# Patient Record
Sex: Male | Born: 1980 | Race: Black or African American | Hispanic: No | Marital: Single | Smoking: Current some day smoker
Health system: Southern US, Community
[De-identification: ages and names within clinical notes are randomized; demographics above are authoritative.]

## PROBLEM LIST (undated history)

## (undated) DIAGNOSIS — Z969 Presence of functional implant, unspecified: Secondary | ICD-10-CM

## (undated) DIAGNOSIS — Z8614 Personal history of Methicillin resistant Staphylococcus aureus infection: Secondary | ICD-10-CM

## (undated) DIAGNOSIS — IMO0002 Reserved for concepts with insufficient information to code with codable children: Secondary | ICD-10-CM

---
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
## 2000-08-25 ENCOUNTER — Emergency Department (HOSPITAL_COMMUNITY): Admission: EM | Admit: 2000-08-25 | Discharge: 2000-08-25 | Payer: Self-pay | Admitting: Emergency Medicine

## 2000-08-30 ENCOUNTER — Emergency Department (HOSPITAL_COMMUNITY): Admission: EM | Admit: 2000-08-30 | Discharge: 2000-08-30 | Payer: Self-pay

## 2000-08-30 ENCOUNTER — Encounter: Payer: Self-pay | Admitting: Emergency Medicine

## 2000-08-31 ENCOUNTER — Encounter: Payer: Self-pay | Admitting: Emergency Medicine

## 2000-08-31 ENCOUNTER — Emergency Department (HOSPITAL_COMMUNITY): Admission: EM | Admit: 2000-08-31 | Discharge: 2000-08-31 | Payer: Self-pay | Admitting: Emergency Medicine

## 2000-11-08 ENCOUNTER — Emergency Department (HOSPITAL_COMMUNITY): Admission: EM | Admit: 2000-11-08 | Discharge: 2000-11-09 | Payer: Self-pay | Admitting: Emergency Medicine

## 2000-11-09 ENCOUNTER — Encounter: Payer: Self-pay | Admitting: Emergency Medicine

## 2001-11-24 ENCOUNTER — Emergency Department (HOSPITAL_COMMUNITY): Admission: EM | Admit: 2001-11-24 | Discharge: 2001-11-24 | Payer: Self-pay | Admitting: Emergency Medicine

## 2002-01-01 ENCOUNTER — Emergency Department (HOSPITAL_COMMUNITY): Admission: EM | Admit: 2002-01-01 | Discharge: 2002-01-01 | Payer: Self-pay | Admitting: Emergency Medicine

## 2002-01-06 ENCOUNTER — Emergency Department (HOSPITAL_COMMUNITY): Admission: EM | Admit: 2002-01-06 | Discharge: 2002-01-06 | Payer: Self-pay | Admitting: Emergency Medicine

## 2002-02-04 ENCOUNTER — Emergency Department (HOSPITAL_COMMUNITY): Admission: EM | Admit: 2002-02-04 | Discharge: 2002-02-04 | Payer: Self-pay | Admitting: Emergency Medicine

## 2002-02-26 ENCOUNTER — Emergency Department (HOSPITAL_COMMUNITY): Admission: EM | Admit: 2002-02-26 | Discharge: 2002-02-26 | Payer: Self-pay | Admitting: Emergency Medicine

## 2002-08-06 ENCOUNTER — Emergency Department (HOSPITAL_COMMUNITY): Admission: EM | Admit: 2002-08-06 | Discharge: 2002-08-07 | Payer: Self-pay | Admitting: Specialist

## 2002-08-28 ENCOUNTER — Emergency Department (HOSPITAL_COMMUNITY): Admission: EM | Admit: 2002-08-28 | Discharge: 2002-08-28 | Payer: Self-pay | Admitting: Emergency Medicine

## 2002-09-15 ENCOUNTER — Emergency Department (HOSPITAL_COMMUNITY): Admission: EM | Admit: 2002-09-15 | Discharge: 2002-09-15 | Payer: Self-pay | Admitting: Emergency Medicine

## 2002-09-15 ENCOUNTER — Encounter: Payer: Self-pay | Admitting: *Deleted

## 2003-01-05 ENCOUNTER — Emergency Department (HOSPITAL_COMMUNITY): Admission: EM | Admit: 2003-01-05 | Discharge: 2003-01-05 | Payer: Self-pay | Admitting: Emergency Medicine

## 2003-01-06 ENCOUNTER — Emergency Department (HOSPITAL_COMMUNITY): Admission: AD | Admit: 2003-01-06 | Discharge: 2003-01-06 | Payer: Self-pay | Admitting: Family Medicine

## 2003-01-10 ENCOUNTER — Encounter: Admission: RE | Admit: 2003-01-10 | Discharge: 2003-01-10 | Payer: Self-pay | Admitting: Internal Medicine

## 2003-01-21 ENCOUNTER — Ambulatory Visit: Admission: RE | Admit: 2003-01-21 | Discharge: 2003-01-21 | Payer: Self-pay | Admitting: Internal Medicine

## 2003-01-23 ENCOUNTER — Encounter: Admission: RE | Admit: 2003-01-23 | Discharge: 2003-01-23 | Payer: Self-pay | Admitting: Internal Medicine

## 2003-02-17 ENCOUNTER — Encounter: Admission: RE | Admit: 2003-02-17 | Discharge: 2003-02-17 | Payer: Self-pay | Admitting: Internal Medicine

## 2003-02-20 ENCOUNTER — Emergency Department (HOSPITAL_COMMUNITY): Admission: EM | Admit: 2003-02-20 | Discharge: 2003-02-20 | Payer: Self-pay | Admitting: Emergency Medicine

## 2003-06-01 ENCOUNTER — Emergency Department (HOSPITAL_COMMUNITY): Admission: EM | Admit: 2003-06-01 | Discharge: 2003-06-01 | Payer: Self-pay | Admitting: Emergency Medicine

## 2003-09-10 ENCOUNTER — Emergency Department (HOSPITAL_COMMUNITY): Admission: EM | Admit: 2003-09-10 | Discharge: 2003-09-10 | Payer: Self-pay | Admitting: Emergency Medicine

## 2003-11-02 ENCOUNTER — Emergency Department (HOSPITAL_COMMUNITY): Admission: EM | Admit: 2003-11-02 | Discharge: 2003-11-02 | Payer: Self-pay | Admitting: Emergency Medicine

## 2003-12-12 ENCOUNTER — Emergency Department (HOSPITAL_COMMUNITY): Admission: EM | Admit: 2003-12-12 | Discharge: 2003-12-12 | Payer: Self-pay | Admitting: Emergency Medicine

## 2004-04-05 ENCOUNTER — Emergency Department (HOSPITAL_COMMUNITY): Admission: EM | Admit: 2004-04-05 | Discharge: 2004-04-05 | Payer: Self-pay | Admitting: Emergency Medicine

## 2005-11-27 ENCOUNTER — Emergency Department (HOSPITAL_COMMUNITY): Admission: EM | Admit: 2005-11-27 | Discharge: 2005-11-27 | Payer: Self-pay | Admitting: Emergency Medicine

## 2006-03-31 ENCOUNTER — Emergency Department (HOSPITAL_COMMUNITY): Admission: EM | Admit: 2006-03-31 | Discharge: 2006-03-31 | Payer: Self-pay | Admitting: Emergency Medicine

## 2006-05-30 ENCOUNTER — Emergency Department (HOSPITAL_COMMUNITY): Admission: EM | Admit: 2006-05-30 | Discharge: 2006-05-30 | Payer: Self-pay | Admitting: Emergency Medicine

## 2006-06-08 ENCOUNTER — Emergency Department (HOSPITAL_COMMUNITY): Admission: EM | Admit: 2006-06-08 | Discharge: 2006-06-08 | Payer: Self-pay | Admitting: Emergency Medicine

## 2006-12-19 ENCOUNTER — Emergency Department (HOSPITAL_COMMUNITY): Admission: EM | Admit: 2006-12-19 | Discharge: 2006-12-19 | Payer: Self-pay | Admitting: Emergency Medicine

## 2006-12-28 ENCOUNTER — Emergency Department (HOSPITAL_COMMUNITY): Admission: EM | Admit: 2006-12-28 | Discharge: 2006-12-28 | Payer: Self-pay | Admitting: Emergency Medicine

## 2007-04-08 ENCOUNTER — Emergency Department (HOSPITAL_COMMUNITY): Admission: EM | Admit: 2007-04-08 | Discharge: 2007-04-08 | Payer: Self-pay | Admitting: Emergency Medicine

## 2007-07-08 ENCOUNTER — Emergency Department (HOSPITAL_COMMUNITY): Admission: EM | Admit: 2007-07-08 | Discharge: 2007-07-08 | Payer: Self-pay | Admitting: Emergency Medicine

## 2007-08-11 ENCOUNTER — Emergency Department (HOSPITAL_COMMUNITY): Admission: EM | Admit: 2007-08-11 | Discharge: 2007-08-12 | Payer: Self-pay | Admitting: Emergency Medicine

## 2007-09-03 ENCOUNTER — Emergency Department (HOSPITAL_COMMUNITY): Admission: EM | Admit: 2007-09-03 | Discharge: 2007-09-04 | Payer: Self-pay | Admitting: Emergency Medicine

## 2007-09-08 ENCOUNTER — Emergency Department (HOSPITAL_COMMUNITY): Admission: EM | Admit: 2007-09-08 | Discharge: 2007-09-08 | Payer: Self-pay | Admitting: Emergency Medicine

## 2007-09-11 ENCOUNTER — Emergency Department (HOSPITAL_COMMUNITY): Admission: EM | Admit: 2007-09-11 | Discharge: 2007-09-11 | Payer: Self-pay | Admitting: Family Medicine

## 2007-09-25 ENCOUNTER — Emergency Department (HOSPITAL_COMMUNITY): Admission: EM | Admit: 2007-09-25 | Discharge: 2007-09-25 | Payer: Self-pay | Admitting: Family Medicine

## 2007-10-18 ENCOUNTER — Encounter: Admission: RE | Admit: 2007-10-18 | Discharge: 2007-10-18 | Payer: Self-pay | Admitting: Internal Medicine

## 2007-11-04 ENCOUNTER — Emergency Department (HOSPITAL_COMMUNITY): Admission: EM | Admit: 2007-11-04 | Discharge: 2007-11-04 | Payer: Self-pay | Admitting: Emergency Medicine

## 2007-12-11 ENCOUNTER — Emergency Department (HOSPITAL_COMMUNITY): Admission: EM | Admit: 2007-12-11 | Discharge: 2007-12-11 | Payer: Self-pay | Admitting: Emergency Medicine

## 2007-12-19 ENCOUNTER — Emergency Department (HOSPITAL_COMMUNITY): Admission: EM | Admit: 2007-12-19 | Discharge: 2007-12-19 | Payer: Self-pay | Admitting: Emergency Medicine

## 2008-06-17 ENCOUNTER — Other Ambulatory Visit: Payer: Self-pay | Admitting: Emergency Medicine

## 2008-06-17 ENCOUNTER — Ambulatory Visit: Payer: Self-pay | Admitting: Psychiatry

## 2008-06-18 ENCOUNTER — Observation Stay (HOSPITAL_COMMUNITY): Admission: AD | Admit: 2008-06-18 | Discharge: 2008-06-18 | Payer: Self-pay | Admitting: Psychiatry

## 2008-07-27 ENCOUNTER — Emergency Department (HOSPITAL_COMMUNITY): Admission: EM | Admit: 2008-07-27 | Discharge: 2008-07-27 | Payer: Self-pay | Admitting: Emergency Medicine

## 2008-09-09 ENCOUNTER — Emergency Department (HOSPITAL_COMMUNITY): Admission: EM | Admit: 2008-09-09 | Discharge: 2008-09-10 | Payer: Self-pay | Admitting: Emergency Medicine

## 2008-09-30 ENCOUNTER — Emergency Department (HOSPITAL_COMMUNITY): Admission: EM | Admit: 2008-09-30 | Discharge: 2008-09-30 | Payer: Self-pay | Admitting: Emergency Medicine

## 2008-11-10 ENCOUNTER — Other Ambulatory Visit: Payer: Self-pay | Admitting: Emergency Medicine

## 2008-11-11 ENCOUNTER — Inpatient Hospital Stay (HOSPITAL_COMMUNITY): Admission: AD | Admit: 2008-11-11 | Discharge: 2008-11-12 | Payer: Self-pay | Admitting: Psychiatry

## 2008-11-11 ENCOUNTER — Ambulatory Visit: Payer: Self-pay | Admitting: Psychiatry

## 2008-11-27 ENCOUNTER — Emergency Department (HOSPITAL_COMMUNITY): Admission: EM | Admit: 2008-11-27 | Discharge: 2008-11-27 | Payer: Self-pay | Admitting: Emergency Medicine

## 2008-12-02 ENCOUNTER — Emergency Department (HOSPITAL_COMMUNITY): Admission: EM | Admit: 2008-12-02 | Discharge: 2008-12-02 | Payer: Self-pay | Admitting: Emergency Medicine

## 2008-12-09 ENCOUNTER — Emergency Department (HOSPITAL_COMMUNITY): Admission: EM | Admit: 2008-12-09 | Discharge: 2008-12-09 | Payer: Self-pay | Admitting: Emergency Medicine

## 2009-03-16 ENCOUNTER — Emergency Department (HOSPITAL_COMMUNITY): Admission: EM | Admit: 2009-03-16 | Discharge: 2009-03-16 | Payer: Self-pay | Admitting: Family Medicine

## 2009-04-23 ENCOUNTER — Other Ambulatory Visit: Payer: Self-pay | Admitting: Emergency Medicine

## 2009-04-24 ENCOUNTER — Other Ambulatory Visit: Payer: Self-pay | Admitting: Emergency Medicine

## 2009-04-24 ENCOUNTER — Ambulatory Visit: Payer: Self-pay | Admitting: Psychiatry

## 2009-04-24 ENCOUNTER — Observation Stay (HOSPITAL_COMMUNITY): Admission: EM | Admit: 2009-04-24 | Discharge: 2009-04-24 | Payer: Self-pay | Admitting: Psychiatry

## 2009-06-04 ENCOUNTER — Emergency Department (HOSPITAL_COMMUNITY): Admission: EM | Admit: 2009-06-04 | Discharge: 2009-06-05 | Payer: Self-pay | Admitting: Emergency Medicine

## 2009-06-06 ENCOUNTER — Emergency Department (HOSPITAL_COMMUNITY): Admission: EM | Admit: 2009-06-06 | Discharge: 2009-06-06 | Payer: Self-pay | Admitting: Emergency Medicine

## 2009-06-06 ENCOUNTER — Emergency Department (HOSPITAL_COMMUNITY): Admission: EM | Admit: 2009-06-06 | Discharge: 2009-06-06 | Payer: Self-pay | Admitting: Family Medicine

## 2009-06-09 ENCOUNTER — Emergency Department (HOSPITAL_COMMUNITY): Admission: EM | Admit: 2009-06-09 | Discharge: 2009-06-09 | Payer: Self-pay | Admitting: Emergency Medicine

## 2009-06-14 ENCOUNTER — Inpatient Hospital Stay (HOSPITAL_COMMUNITY)
Admission: EM | Admit: 2009-06-14 | Discharge: 2009-06-16 | Payer: Self-pay | Source: Home / Self Care | Admitting: Emergency Medicine

## 2009-06-15 HISTORY — PX: ORIF RADIUS & ULNA FRACTURES: SHX2129

## 2009-07-13 ENCOUNTER — Emergency Department (HOSPITAL_COMMUNITY): Admission: EM | Admit: 2009-07-13 | Discharge: 2009-07-13 | Payer: Self-pay | Admitting: Family Medicine

## 2009-07-17 ENCOUNTER — Emergency Department (HOSPITAL_COMMUNITY): Admission: EM | Admit: 2009-07-17 | Discharge: 2009-07-17 | Payer: Self-pay | Admitting: Emergency Medicine

## 2010-04-26 LAB — URINALYSIS, ROUTINE W REFLEX MICROSCOPIC
Bilirubin Urine: NEGATIVE
Glucose, UA: NEGATIVE mg/dL
Ketones, ur: 15 mg/dL — AB
pH: 6 (ref 5.0–8.0)

## 2010-04-26 LAB — RAPID URINE DRUG SCREEN, HOSP PERFORMED
Benzodiazepines: NOT DETECTED
Cocaine: POSITIVE — AB
Tetrahydrocannabinol: NOT DETECTED

## 2010-04-27 LAB — COMPREHENSIVE METABOLIC PANEL
ALT: 19 U/L (ref 0–53)
CO2: 24 mEq/L (ref 19–32)
Calcium: 8.6 mg/dL (ref 8.4–10.5)
Creatinine, Ser: 1.33 mg/dL (ref 0.4–1.5)
GFR calc non Af Amer: >60 mL/min (ref >60)
Glucose, Bld: 106 mg/dL — ABNORMAL HIGH (ref 70–99)
Sodium: 140 mEq/L (ref 135–145)
Total Bilirubin: 0.6 mg/dL (ref 0.3–1.2)

## 2010-04-27 LAB — CBC
Hemoglobin: 14.3 g/dL (ref 13.0–17.0)
MCHC: 33.7 g/dL (ref 30.0–36.0)
MCV: 86.1 fL (ref 78.0–100.0)
RBC: 4.91 MIL/uL (ref 4.22–5.81)

## 2010-04-27 LAB — ETHANOL: Alcohol, Ethyl (B): 282 mg/dL — ABNORMAL HIGH (ref 0–10)

## 2010-04-27 LAB — PROTIME-INR: Prothrombin Time: 12 seconds (ref 11.6–15.2)

## 2010-04-27 LAB — CK TOTAL AND CKMB (NOT AT ARMC): Total CK: 291 U/L — ABNORMAL HIGH (ref 7–232)

## 2010-04-30 LAB — URINALYSIS, ROUTINE W REFLEX MICROSCOPIC
Bilirubin Urine: NEGATIVE
Hgb urine dipstick: NEGATIVE
Ketones, ur: NEGATIVE mg/dL
Nitrite: NEGATIVE
Urobilinogen, UA: 1 mg/dL (ref 0.0–1.0)

## 2010-04-30 LAB — RAPID URINE DRUG SCREEN, HOSP PERFORMED
Amphetamines: NOT DETECTED
Tetrahydrocannabinol: NOT DETECTED

## 2010-04-30 LAB — CBC
HCT: 40.8 % (ref 39.0–52.0)
Hemoglobin: 13.3 g/dL (ref 13.0–17.0)
MCHC: 32.4 g/dL (ref 30.0–36.0)
MCV: 86.6 fL (ref 78.0–100.0)
RDW: 14.1 % (ref 11.5–15.5)

## 2010-04-30 LAB — DIFFERENTIAL
Basophils Absolute: 0.1 10*3/uL (ref 0.0–0.1)
Basophils Relative: 1 % (ref 0–1)
Eosinophils Absolute: 0.1 10*3/uL (ref 0.0–0.7)
Eosinophils Relative: 1 % (ref 0–5)
Monocytes Absolute: 0.7 10*3/uL (ref 0.1–1.0)
Neutro Abs: 2.8 10*3/uL (ref 1.7–7.7)

## 2010-04-30 LAB — BASIC METABOLIC PANEL
CO2: 28 mEq/L (ref 19–32)
Calcium: 8.8 mg/dL (ref 8.4–10.5)
Chloride: 108 mEq/L (ref 96–112)
Glucose, Bld: 111 mg/dL — ABNORMAL HIGH (ref 70–99)
Sodium: 142 mEq/L (ref 135–145)

## 2010-05-12 LAB — POCT I-STAT, CHEM 8
BUN: 7 mg/dL (ref 6–23)
Chloride: 105 mEq/L (ref 96–112)
HCT: 43 % (ref 39.0–52.0)
Sodium: 142 mEq/L (ref 135–145)
TCO2: 26 mmol/L (ref 0–100)

## 2010-05-12 LAB — GLUCOSE, CAPILLARY

## 2010-05-13 LAB — POCT I-STAT, CHEM 8
BUN: 5 mg/dL — ABNORMAL LOW (ref 6–23)
Calcium, Ion: 1.11 mmol/L — ABNORMAL LOW (ref 1.12–1.32)
Chloride: 105 mEq/L (ref 96–112)
Glucose, Bld: 83 mg/dL (ref 70–99)
HCT: 45 % (ref 39.0–52.0)
Potassium: 3.5 mEq/L (ref 3.5–5.1)

## 2010-05-13 LAB — DIFFERENTIAL
Basophils Absolute: 0 10*3/uL (ref 0.0–0.1)
Basophils Relative: 0 % (ref 0–1)
Eosinophils Absolute: 0.1 10*3/uL (ref 0.0–0.7)
Monocytes Relative: 11 % (ref 3–12)
Neutro Abs: 2.5 10*3/uL (ref 1.7–7.7)
Neutrophils Relative %: 38 % — ABNORMAL LOW (ref 43–77)

## 2010-05-13 LAB — CBC
MCHC: 33.6 g/dL (ref 30.0–36.0)
MCV: 85 fL (ref 78.0–100.0)
Platelets: 303 10*3/uL (ref 150–400)
RDW: 14 % (ref 11.5–15.5)

## 2010-05-13 LAB — RAPID URINE DRUG SCREEN, HOSP PERFORMED
Amphetamines: NOT DETECTED
Barbiturates: NOT DETECTED
Benzodiazepines: NOT DETECTED
Cocaine: POSITIVE — AB

## 2010-05-13 LAB — BASIC METABOLIC PANEL
BUN: 14 mg/dL (ref 6–23)
CO2: 27 mEq/L (ref 19–32)
Calcium: 9.3 mg/dL (ref 8.4–10.5)
Chloride: 110 mEq/L (ref 96–112)
Creatinine, Ser: 1.15 mg/dL (ref 0.4–1.5)

## 2010-05-15 LAB — BASIC METABOLIC PANEL
BUN: 11 mg/dL (ref 6–23)
Calcium: 9.1 mg/dL (ref 8.4–10.5)
Creatinine, Ser: 1.32 mg/dL (ref 0.4–1.5)
GFR calc Af Amer: >60 mL/min (ref >60)
GFR calc non Af Amer: >60 mL/min (ref >60)
GFR calc non Af Amer: >60 mL/min (ref >60)
Glucose, Bld: 84 mg/dL (ref 70–99)
Potassium: 3.6 mEq/L (ref 3.5–5.1)
Sodium: 140 mEq/L (ref 135–145)

## 2010-05-15 LAB — CK TOTAL AND CKMB (NOT AT ARMC)
Relative Index: 0.5 (ref 0.0–2.5)
Total CK: 194 U/L (ref 7–232)

## 2010-05-15 LAB — DIFFERENTIAL
Basophils Absolute: 0 10*3/uL (ref 0.0–0.1)
Lymphocytes Relative: 42 % (ref 12–46)
Lymphs Abs: 2.5 10*3/uL (ref 0.7–4.0)
Monocytes Absolute: 0.5 10*3/uL (ref 0.1–1.0)
Neutro Abs: 3 10*3/uL (ref 1.7–7.7)

## 2010-05-15 LAB — POCT CARDIAC MARKERS
CKMB, poc: <1 ng/mL — ABNORMAL LOW (ref 1.0–8.0)
CKMB, poc: <1 ng/mL — ABNORMAL LOW (ref 1.0–8.0)
Myoglobin, poc: 52.2 ng/mL (ref 12–200)
Troponin i, poc: <0.05 ng/mL (ref 0.00–0.09)
Troponin i, poc: <0.05 ng/mL (ref 0.00–0.09)

## 2010-05-15 LAB — CBC
Hemoglobin: 14.6 g/dL (ref 13.0–17.0)
Platelets: 319 10*3/uL (ref 150–400)
RDW: 14.4 % (ref 11.5–15.5)
WBC: 6.1 10*3/uL (ref 4.0–10.5)
WBC: 6.8 10*3/uL (ref 4.0–10.5)

## 2010-05-15 LAB — D-DIMER, QUANTITATIVE: D-Dimer, Quant: <0.22 ug/mL-FEU (ref 0.00–0.48)

## 2010-05-17 LAB — POCT CARDIAC MARKERS: Myoglobin, poc: 57.6 ng/mL (ref 12–200)

## 2010-05-17 LAB — D-DIMER, QUANTITATIVE: D-Dimer, Quant: <0.22 ug/mL-FEU (ref 0.00–0.48)

## 2010-05-17 LAB — CBC
Hemoglobin: 13.3 g/dL (ref 13.0–17.0)
MCHC: 33.2 g/dL (ref 30.0–36.0)
MCV: 84.9 fL (ref 78.0–100.0)
RBC: 4.71 MIL/uL (ref 4.22–5.81)

## 2010-05-17 LAB — PROTIME-INR: Prothrombin Time: 13.1 seconds (ref 11.6–15.2)

## 2010-05-17 LAB — COMPREHENSIVE METABOLIC PANEL
ALT: 27 U/L (ref 0–53)
CO2: 26 mEq/L (ref 19–32)
Calcium: 8.7 mg/dL (ref 8.4–10.5)
Creatinine, Ser: 1.13 mg/dL (ref 0.4–1.5)
GFR calc non Af Amer: >60 mL/min (ref >60)
Glucose, Bld: 101 mg/dL — ABNORMAL HIGH (ref 70–99)
Sodium: 137 mEq/L (ref 135–145)

## 2010-05-17 LAB — DIFFERENTIAL
Basophils Absolute: 0 10*3/uL (ref 0.0–0.1)
Eosinophils Absolute: 0.1 10*3/uL (ref 0.0–0.7)
Lymphs Abs: 2.1 10*3/uL (ref 0.7–4.0)
Neutrophils Relative %: 51 % (ref 43–77)

## 2010-05-18 LAB — CBC
HCT: 43.8 % (ref 39.0–52.0)
Hemoglobin: 14.9 g/dL (ref 13.0–17.0)
MCV: 84.2 fL (ref 78.0–100.0)
Platelets: 332 10*3/uL (ref 150–400)
RBC: 5.2 MIL/uL (ref 4.22–5.81)
WBC: 7.1 10*3/uL (ref 4.0–10.5)

## 2010-05-18 LAB — POCT I-STAT, CHEM 8
Chloride: 104 mEq/L (ref 96–112)
HCT: 49 % (ref 39.0–52.0)
Hemoglobin: 16.7 g/dL (ref 13.0–17.0)
Potassium: 3.2 mEq/L — ABNORMAL LOW (ref 3.5–5.1)
Sodium: 143 mEq/L (ref 135–145)

## 2010-05-18 LAB — DIFFERENTIAL
Eosinophils Absolute: 0.2 10*3/uL (ref 0.0–0.7)
Eosinophils Relative: 2 % (ref 0–5)
Lymphs Abs: 2.6 10*3/uL (ref 0.7–4.0)
Monocytes Absolute: 0.5 10*3/uL (ref 0.1–1.0)
Monocytes Relative: 7 % (ref 3–12)

## 2010-05-18 LAB — RAPID URINE DRUG SCREEN, HOSP PERFORMED
Amphetamines: NOT DETECTED
Benzodiazepines: NOT DETECTED
Tetrahydrocannabinol: NOT DETECTED

## 2010-06-22 NOTE — Consult Note (Signed)
NAMEZARIAN, COLPITTS NO.:  [id number]   MEDICAL RECORD NO.:  [id number]          PATIENT TYPE:  IPS   LOCATION:  0504                          FACILITY:  [facility]   PHYSICIAN:  Antonietta Breach, M.D.  DATE OF BIRTH:  1980/08/14   DATE OF CONSULTATION:  06/17/2008  DATE OF DISCHARGE:                                 CONSULTATION   REQUESTING PHYSICIAN:  [facility].   REASON FOR CONSULTATION:  Psychosis.   HISTORY OF PRESENT ILLNESS:  Jermaine Hunt is a 30 year old male  presenting to the [facility] with  psychosis.  He has been  seeing leprechauns dancing around.  These visual hallucinations do occur  when he is completely wide awake.  He has been responding to internal  stimulation and has been displaying bizarre behavior.  At one point in  the emergency room, he held his mouth up as if he was swallowing  something and from what the staff nurse can gather, he did not have  anything in his hand.  However, he is undergoing medical clearance  workup.   The precipitating stress is unknown.  However, he has been drinking some  alcohol.  He is not slurring his speech.  He goes through periods of  poverty of speech.  He does have a prior diagnosis of schizoaffective  disorder.   The patient had called the [facility] stating that he  could not go on any more.  They dispatched his case manager.  He  reported to the case manager, the social worker with him, that he wanted  to end it.  He had reported to the emergency room staff that he was  going to jump off a building.  He clearly is demonstrating labile mood.   PAST PSYCHIATRIC HISTORY:  He has been diagnosed with schizoaffective  disorder.  He has been noncompliant with medication.  A social worker is  present with the patient from the community.  She states that he has a  pattern of noncompliance with medications.  The undersigned asked her if  she knows whether he  has ever had intramuscular decanoate maintenance  and she does not know.   FAMILY PSYCHIATRIC HISTORY:  None known.   SOCIAL HISTORY:  As mentioned, a community Child psychotherapist is with the  patient.  He is unemployed and medically disabled.  He does drink  alcohol.  There are no illegal drugs known.  As reported, he is a poor  historian at this time.  He does smoke cigarettes.  He states that he  lives alone.   PAST MEDICAL HISTORY:  None known.   MEDICATIONS:  His MAR is reviewed.  His medications have not been  started.  However, his medication list on presentation included Seroquel  and Abilify, dosages unknown.   ALLERGIES:  SULFA.   LABORATORY DATA:  WBC 7.1, hemoglobin 14.9, platelet count 332.   REVIEW OF SYSTEMS:  Jermaine Hunt can provide little of this.  It is gleaned  from the staff, the medical record and past medical record.  Constitutional, head, eyes, ears, nose, throat,  mouth, neurologic,  psychiatric, cardiovascular, respiratory, gastrointestinal,  genitourinary, skin, musculoskeletal, hematologic, lymphatic, endocrine,  metabolic all unremarkable.   PHYSICAL EXAMINATION:  VITAL SIGNS:  Temperature 98, pulse 78,  respiratory rate 20, blood pressure 141/68, O2 saturation on room air  98%.  GENERAL APPEARANCE:  Jermaine Hunt is a young male appearing his chronologic  age, obese, lying in a supine position in his hospital bed with no  abnormal involuntary movements.   MENTAL STATUS EXAM:  Jermaine Hunt does have poverty of speech.  He does go  on to describe the leprechauns mentioned in the history of present  illness, but then is guarded regarding any further history.  He does  mention that he would like a soda.  He is alert.  His attention span is  slightly decreased.  Concentration is mildly decreased.  His affect is  flat at this time.  His mood is slightly anxious.  He is grossly  oriented to all spheres.  His memory is grossly intact.  His fund of  knowledge and  intelligence are difficult to discern.  His speech is  impoverished.  When he does talk, there is no dysarthria or pressured  speech.  Thought process is coherent, but there is some illogia thought  content.  He describes leprechauns as mentioned in the history of  present illness.  His insight is poor.  Judgment is impaired.   ASSESSMENT:  AXIS I:  293.82, psychotic disorder not otherwise  specified.  AXIS II:  Deferred.  AXIS III:  See past medical history.  AXIS IV:  Primary support group.  AXIS V:  20   RECOMMENDATIONS:  1. Low stimulation ego support in order to facilitate a therapeutic      alliance.  2. Low stimulation environment with a sitter.  3. Ativan one-half to 4 mg p.o. IM or IV q.2 h. p.r.n. agitation,      severe anxiety.  4. We will defer antipsychotic medication at this time.  5. Would admit to an inpatient psychiatric unit for further evaluation      and treatment as soon as the patient is medically cleared.  6. Regarding the Ativan use, would utilize caution regarding sedation      or ataxia.      Antonietta Breach, M.D.  Electronically Signed     JW/MEDQ  D:  06/18/2008  T:  06/18/2008  Job:  [id number]

## 2010-06-25 NOTE — Consult Note (Signed)
[facility]  Patient:    Jermaine Hunt, Jermaine Hunt                    MRN: [id number] Proc. Date: 08/31/00 Adm. Date:  [id number] Disc. Date: [id number] Attending:  Devoria Albe                          Consultation Report  VISIT NUMBER:  [id number]  REASON FOR CONSULTATION:  I was asked by EDP to reevaluate Jermaine Hunt.  He was seen yesterday evening with some abdominal pain which had been going on for six days, located in the right lower quadrant, and he had apparently a little right lower quadrant tenderness but negative laboratory studies.  He had a CT scan done which showed a questionable fecalith and question of thickening of the wall of the appendix.  He was seen back here this morning in followup and was apparently better, pain had gone away, was not having any other GI symptoms and with what he described as his minor pain, was more mid-abdomen and he had no tenderness at all.  This was a scheduled visit back for followup.  He came back again this evening, arriving about 5:50, with increased pain again.  This time it seemed to be sharp, radiating a little bit into the right lower abdomen and he did not have much appetite.  Again, this time electrolytes were checked and were unremarkable.  Amylase was normal.  WBC remained 6400 with a normal differential.  CT was repeated and I have reviewed the films with the radiologist.  There does appear to be a small fecalith in the appendix and there is some question that the appendiceal wall is slightly thicker than normal but there is no evidence around the appendix of any inflammatory changes.  When I examined him, he says that he thinks his pain is more muscular and he is really pointing to the left abdomen rather than the right, as he had been earlier.  He has had no nausea or vomiting, fever or chills.  PHYSICAL EXAMINATION:  GENERAL:  On exam, he is alert and oriented.  He is afebrile.  HEENT:   Normal.  LUNGS:  Clear.  HEART:  Regular.  ABDOMEN:  Soft and I can palpate quite deeply into the right lower quadrant without any significant tenderness.  IMPRESSION:  Right lower quadrant pain.  This may be an atypical appendicitis. Certainly, the history is somewhat prolonged.  The CT scan findings are minimal but there is a fecalith present.  He is with no white count and no fever and no secondary signs of appendicitis such as fat stranding, etc, and certainly, no worsening in 24 hours with no treatment, it is hard to make a clear-cut diagnosis that this is appendicitis.  PLAN:  I have gone over alternatives with the patient including going ahead with the laparoscopy and laparoscopic appendectomy tonight.  He is still a little reluctant and says he was feeling somewhat better.  I told him that he probably should be rechecked again in 12 to 24 hours, but at this point, I thought that was acceptable, since we do not have clear-cut evidence of appendicitis. DD:  08/31/00 TD:  09/01/00 Job: [id number]

## 2010-06-25 NOTE — Discharge Summary (Signed)
NAME:  Jermaine Hunt, BARUCH NO.:  [id number]   MEDICAL RECORD NO.:  [id number]          PATIENT TYPE:  OBV   LOCATION:  0504                          FACILITY:  [facility]   PHYSICIAN:  Geoffery Lyons, M.D.      DATE OF BIRTH:  1980-12-29   DATE OF ADMISSION:  06/18/2008  DATE OF DISCHARGE:  06/18/2008                               DISCHARGE SUMMARY   CHIEF COMPLAINT:  This was the first admission to [facility] for this 30 year old male who presented to the emergency room at  [name] with symptoms of psychoses, visual hallucinations while  awake.  He has been displaying bizarre behavior.  He had been drinking  alcohol.  Had a prior diagnosis of schizoaffective disorder.  He had  called the [name] and stated that he could not go on anymore.  He reported to the case manager, the social worker with him, that he  wanted to end it.  Was going to jump off a building.  History as already  stated, diagnosed with schizoaffective disorder, noncompliant with  medication.   MEDICAL HISTORY:  Noncontributory.   MEDICATIONS:  On presentation he was a using Seroquel an Abilify,  dosages unknown.   PHYSICAL EXAM:  Failed to show any acute findings.   PRELIMINARY LABORATORY WORKUP:  White blood cells 7.1, hemoglobin 14.9.   MENTAL STATUS EXAMINATION:  Reveals a male who initially when seen by  Dr. Antonietta Breach to have poverty of speech but he goes on to describe  a leprechaun and then he becomes more guarded.  He is alert cooperative  but his attention span was deemed to be decreased.  Decreased  concentration, somewhat anxious but he is oriented.  Memory is grossly  intact.   ADMISSION DIAGNOSES:  AXIS I:  Psychotic disorder not otherwise  specified.  Alcohol abuse and dependency.  AXIS II:  No diagnosis.  AXIS III:  No diagnosis.  AXIS IV:  Moderate.  AXIS V:  On admission 35, highest GAF in the last year 60.   COURSE IN THE HOSPITAL:  He will he was  admitted and he was started  individual and group and group psychotherapy.  On May 12 on when he was  reassessed, he endorsed he went to the emergency room for back pain and  he might have made the inference to ideas to hurt himself, but he denied  that he would never do that.  Endorsed he would never hurt himself.  He  has 3 children and a job, is going to school.  Wanted to be discharged.  He claimed that he feels like he was being thrown off a bridge and he  was mistaken for a suicidal statement.  Report used cocaine the night  before.  He has two jobs and needs to be discharged from the hospital.  He had 3 children 2, 3, and 4.  He was in full contact reality.  There  were no active suicidal ideations.  We went ahead and discharged to  outpatient followup.   DISCHARGE DIAGNOSES:  AXIS I:  Cocaine abuse, psychosis  not otherwise  specified, resolved.  AXIS II:  No diagnosis.  AXIS III:  No diagnosis.  AXIS IV:  Moderate.  AXIS V:  On discharge 55-60.   FOLLOW UP:  Discharged on no medication to follow up through [facility].      Geoffery Lyons, M.D.  Electronically Signed     IL/MEDQ  D:  07/09/2008  T:  07/09/2008  Job:  [id number]

## 2010-07-26 IMAGING — CT CT HEAD W/O CM
1 series · 16 of 30 positions shown, 20 images · non-contrast
Comparison: 07/27/2008

CLINICAL DATA: Left arm and hand pain and numbness.

CT HEAD WITHOUT CONTRAST
TECHNIQUE: Contiguous axial images were obtained from the base of
the skull through the vertex without contrast.

[Series 2: head_seq 4.5 h37s st · axial · 0.43mm/px · z∈[-210,-84]mm · 16 of 32 slices shown, 20 images]
[im 2/32  brain]
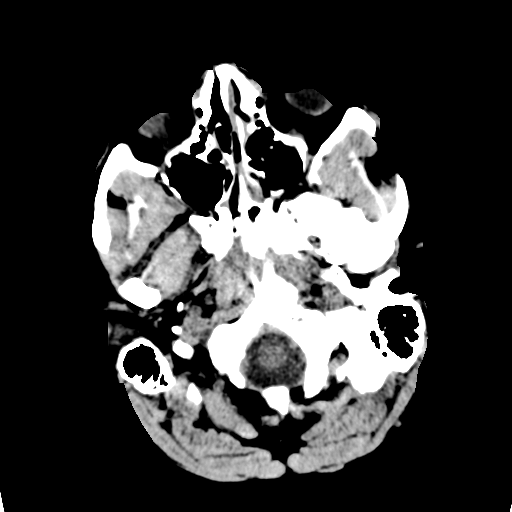
[im 2/32  bone]
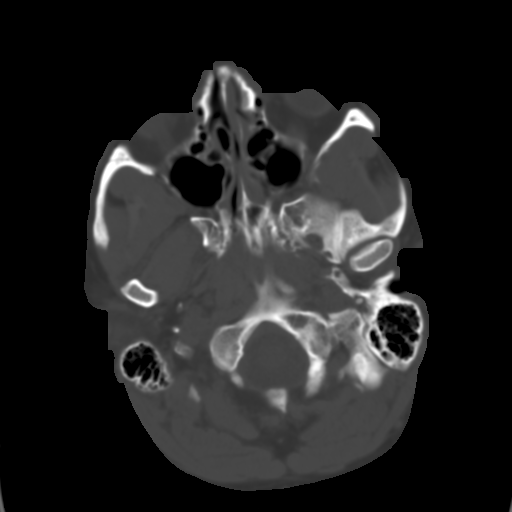
[im 4/32  brain]
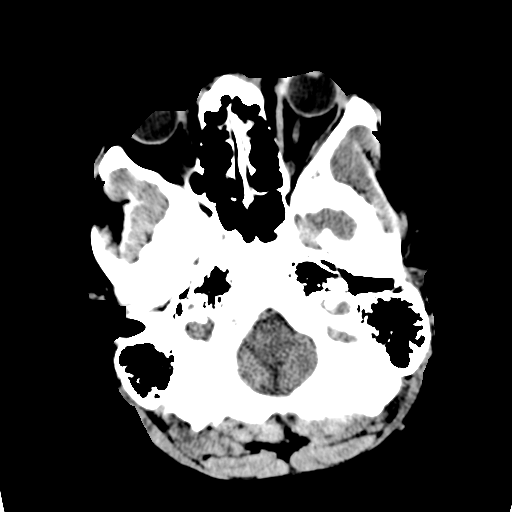
[im 6/32  brain]
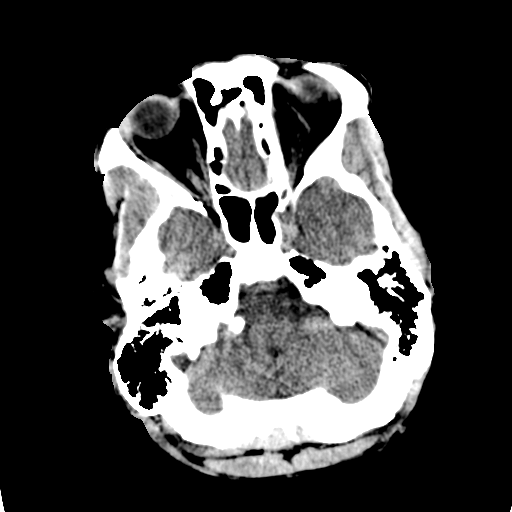
[im 8/32  brain]
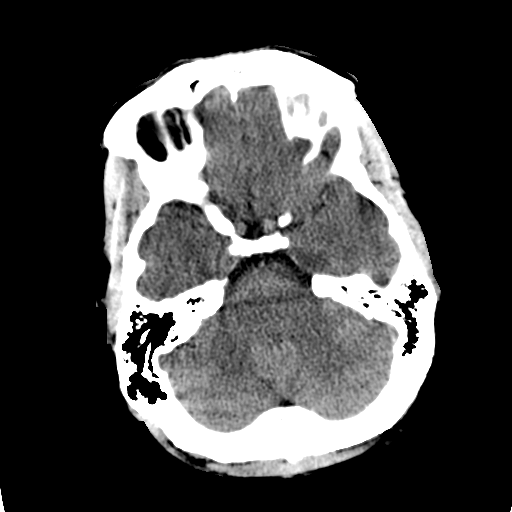
[im 9/32  brain]
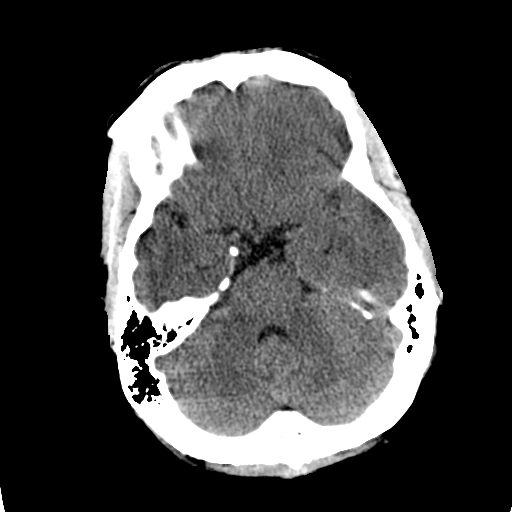
[im 9/32  bone]
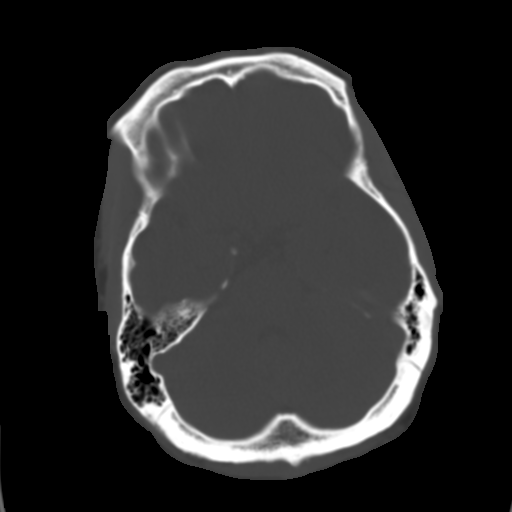
[im 11/32  brain]
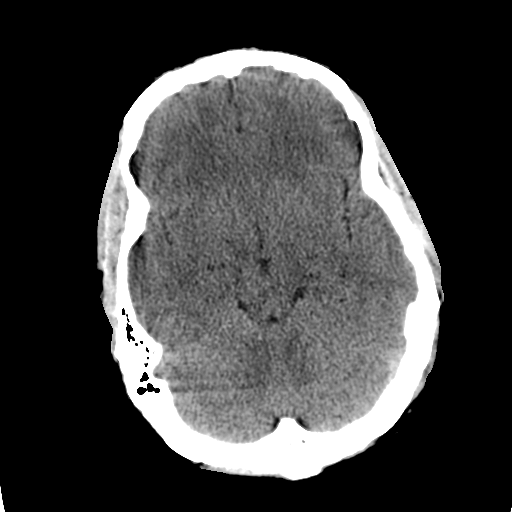
[im 13/32  brain]
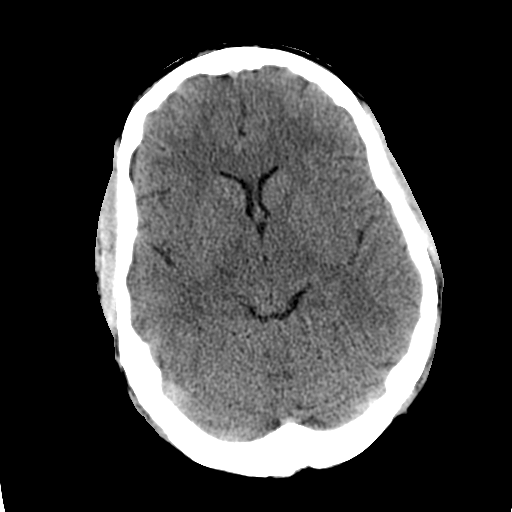
[im 15/32  brain]
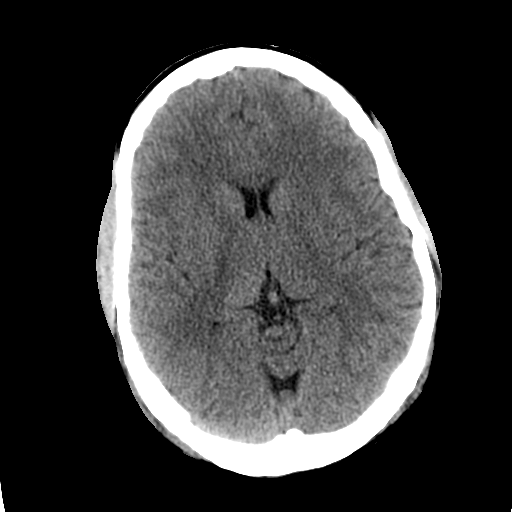
[im 17/32  brain]
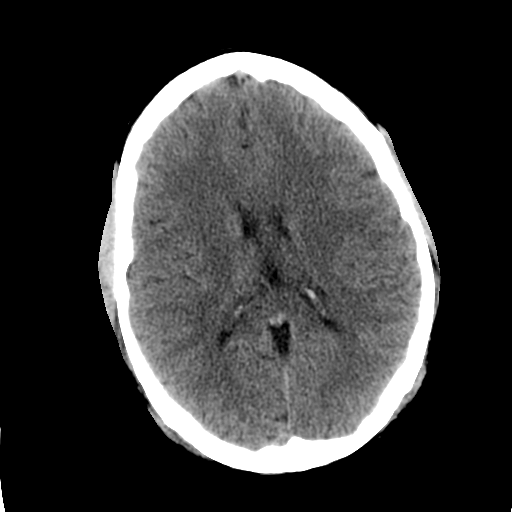
[im 17/32  bone]
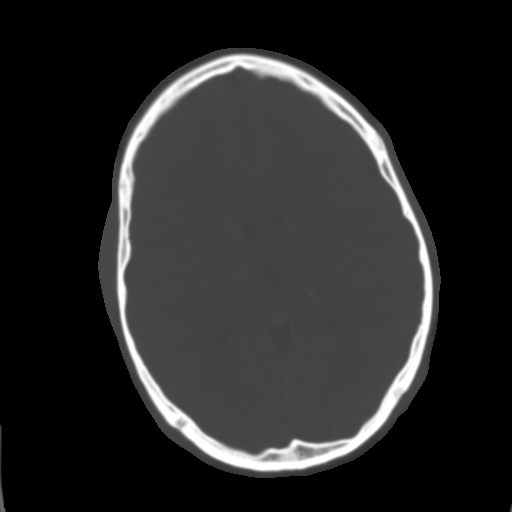
[im 19/32  brain]
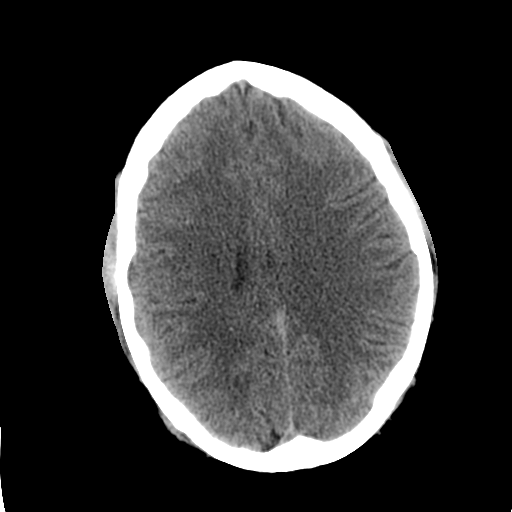
[im 21/32  brain]
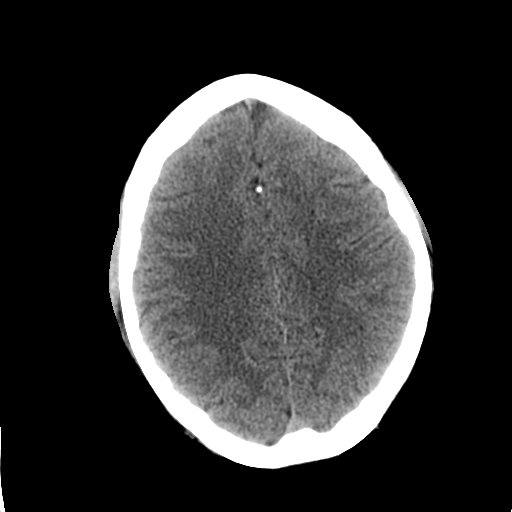
[im 23/32  brain]
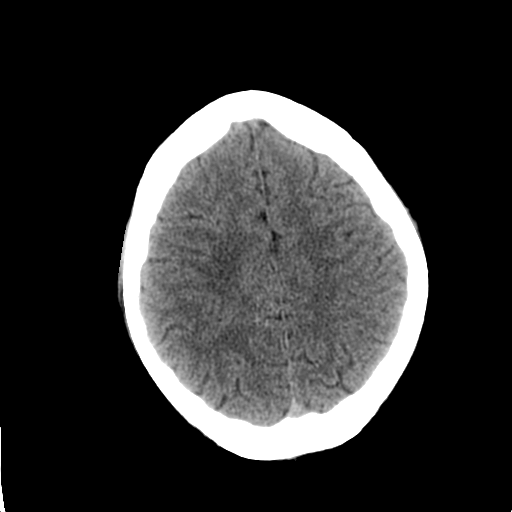
[im 24/32  brain]
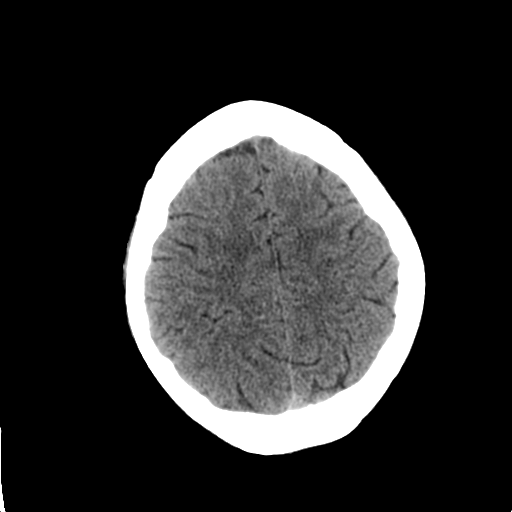
[im 24/32  bone]
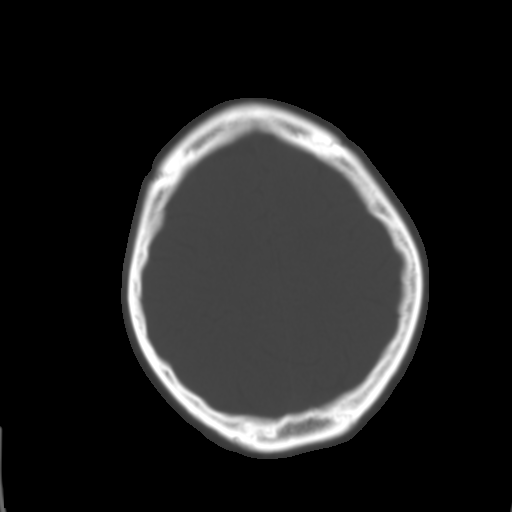
[im 26/32  brain]
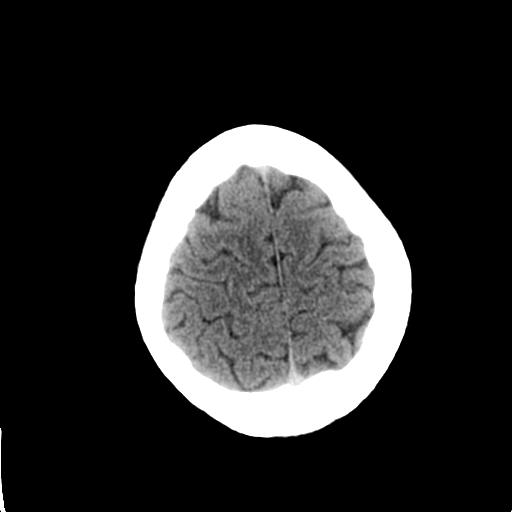
[im 28/32  brain]
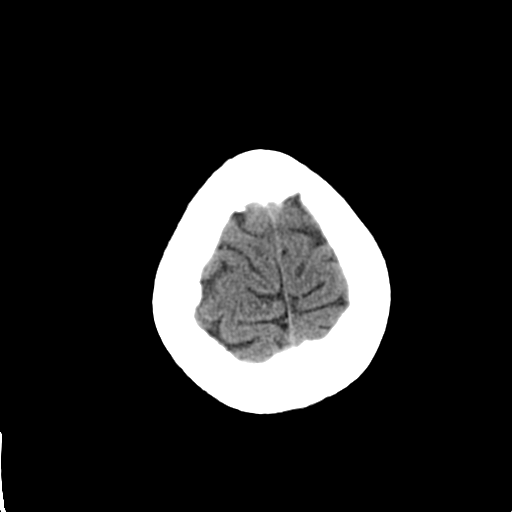
[im 30/32  brain]
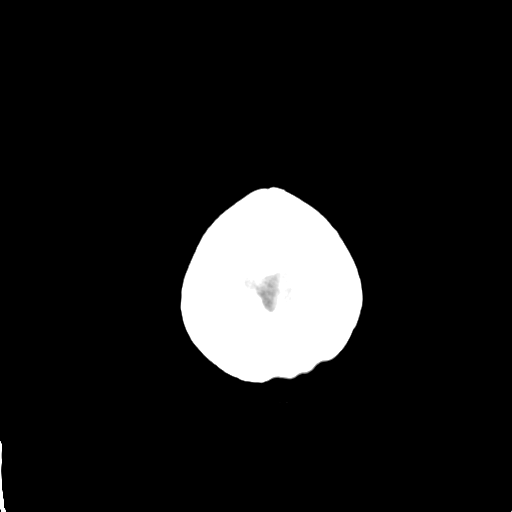

[16 of 30 positions shown; findings below may reference images not displayed]

FINDINGS: The brain has a normal appearance without evidence of
atrophy, old or acute infarction, mass lesion, hemorrhage,
hydrocephalus or extra-axial collection.  Calvarium is
unremarkable.  The sinuses, middle ears and mastoids are clear.
IMPRESSION: Normal head CT.  For future reference, please note the patient has
had 6 previous normal head CTs and one normal brain MRI.

## 2010-11-01 LAB — I-STAT 8, (EC8 V) (CONVERTED LAB)
Bicarbonate: 23.6
HCT: 46
Hemoglobin: 15.6
Operator id: 284141
Sodium: 138
TCO2: 25

## 2010-11-01 LAB — URINALYSIS, ROUTINE W REFLEX MICROSCOPIC
Nitrite: NEGATIVE
Specific Gravity, Urine: 1.024
Urobilinogen, UA: 0.2
pH: 6

## 2010-11-01 LAB — RPR: RPR Ser Ql: NONREACTIVE

## 2010-11-01 LAB — GC/CHLAMYDIA PROBE AMP, GENITAL: Chlamydia, DNA Probe: NEGATIVE

## 2010-11-01 LAB — POCT I-STAT CREATININE: Operator id: 284141

## 2010-11-03 LAB — POCT I-STAT, CHEM 8
BUN: 11
Calcium, Ion: 1.1 — ABNORMAL LOW
Chloride: 107
Glucose, Bld: 71
HCT: 43

## 2010-11-03 LAB — RAPID URINE DRUG SCREEN, HOSP PERFORMED
Amphetamines: NOT DETECTED
Barbiturates: NOT DETECTED
Benzodiazepines: NOT DETECTED
Cocaine: NOT DETECTED
Opiates: NOT DETECTED
Tetrahydrocannabinol: NOT DETECTED

## 2010-11-03 LAB — MONONUCLEOSIS SCREEN: Mono Screen: NEGATIVE

## 2010-11-03 LAB — RAPID STREP SCREEN (MED CTR MEBANE ONLY): Streptococcus, Group A Screen (Direct): NEGATIVE

## 2010-11-04 LAB — CBC
Hemoglobin: 13.8
MCHC: 33.8
MCV: 84.1
RBC: 4.85
WBC: 7.7

## 2010-11-04 LAB — DIFFERENTIAL
Eosinophils Absolute: 0.1
Lymphs Abs: 3.5
Monocytes Absolute: 0.9
Monocytes Relative: 12
Neutro Abs: 3.1
Neutrophils Relative %: 40 — ABNORMAL LOW

## 2010-11-04 LAB — POCT I-STAT, CHEM 8
Chloride: 109
HCT: 44
Hemoglobin: 15
Potassium: 3.4 — ABNORMAL LOW
Sodium: 142

## 2010-11-04 LAB — POCT CARDIAC MARKERS
CKMB, poc: <1 — ABNORMAL LOW
CKMB, poc: <1 — ABNORMAL LOW
Myoglobin, poc: 41.5
Myoglobin, poc: 61.2
Operator id: 133351

## 2010-11-04 LAB — D-DIMER, QUANTITATIVE: D-Dimer, Quant: <0.22

## 2010-11-05 LAB — POCT I-STAT, CHEM 8
Glucose, Bld: 87
HCT: 46
Hemoglobin: 15.6
Potassium: 4.2
Sodium: 142

## 2010-11-05 LAB — DIFFERENTIAL
Basophils Absolute: 0
Basophils Relative: 1
Eosinophils Absolute: 0.1
Monocytes Relative: 11
Neutrophils Relative %: 46

## 2010-11-05 LAB — POCT CARDIAC MARKERS
Myoglobin, poc: 59.4
Troponin i, poc: <0.05

## 2010-11-05 LAB — CBC
MCHC: 33.2
MCV: 83.7
Platelets: 323
RDW: 13.9

## 2010-11-08 LAB — DIFFERENTIAL
Eosinophils Absolute: 0.1
Lymphs Abs: 3
Monocytes Relative: 10
Neutro Abs: 3.7
Neutrophils Relative %: 49

## 2010-11-08 LAB — POCT I-STAT, CHEM 8
Chloride: 107
Creatinine, Ser: 1.3
Glucose, Bld: 88
Potassium: 3.7

## 2010-11-08 LAB — CBC
MCV: 84.3
RBC: 4.87
WBC: 7.7

## 2010-11-08 LAB — RAPID URINE DRUG SCREEN, HOSP PERFORMED: Benzodiazepines: NOT DETECTED

## 2010-11-08 LAB — POCT CARDIAC MARKERS: Troponin i, poc: <0.05

## 2010-11-16 LAB — COMPREHENSIVE METABOLIC PANEL
Albumin: 3.8
Alkaline Phosphatase: 48
BUN: 16
Chloride: 103
Glucose, Bld: 79
Potassium: 4
Total Bilirubin: 1

## 2010-11-16 LAB — DIFFERENTIAL
Basophils Absolute: 0
Basophils Relative: 1
Monocytes Absolute: 0.6
Neutro Abs: 2.2
Neutrophils Relative %: 42 — ABNORMAL LOW

## 2010-11-16 LAB — D-DIMER, QUANTITATIVE: D-Dimer, Quant: <0.22

## 2010-11-16 LAB — CBC
HCT: 40.8
Hemoglobin: 14
RBC: 4.96
WBC: 5.1

## 2011-02-08 DIAGNOSIS — Z8614 Personal history of Methicillin resistant Staphylococcus aureus infection: Secondary | ICD-10-CM

## 2011-02-08 HISTORY — DX: Personal history of Methicillin resistant Staphylococcus aureus infection: Z86.14

## 2012-04-20 ENCOUNTER — Other Ambulatory Visit: Payer: Self-pay | Admitting: Family Medicine

## 2012-04-20 DIAGNOSIS — M79602 Pain in left arm: Secondary | ICD-10-CM

## 2012-04-24 ENCOUNTER — Ambulatory Visit
Admission: RE | Admit: 2012-04-24 | Discharge: 2012-04-24 | Disposition: A | Discharge status: Home / Self Care | Payer: BC Managed Care – PPO | Source: Ambulatory Visit | Attending: Family Medicine | Admitting: Family Medicine

## 2012-04-24 DIAGNOSIS — M79602 Pain in left arm: Secondary | ICD-10-CM

## 2012-07-04 ENCOUNTER — Other Ambulatory Visit: Payer: Self-pay | Admitting: Orthopedic Surgery

## 2012-07-08 DIAGNOSIS — Z969 Presence of functional implant, unspecified: Secondary | ICD-10-CM

## 2012-07-08 DIAGNOSIS — IMO0002 Reserved for concepts with insufficient information to code with codable children: Secondary | ICD-10-CM

## 2012-07-08 HISTORY — DX: Reserved for concepts with insufficient information to code with codable children: IMO0002

## 2012-07-08 HISTORY — DX: Presence of functional implant, unspecified: Z96.9

## 2012-07-12 ENCOUNTER — Encounter (HOSPITAL_BASED_OUTPATIENT_CLINIC_OR_DEPARTMENT_OTHER): Payer: Self-pay | Admitting: *Deleted

## 2012-07-17 ENCOUNTER — Encounter (HOSPITAL_BASED_OUTPATIENT_CLINIC_OR_DEPARTMENT_OTHER): Payer: Self-pay | Admitting: *Deleted

## 2012-07-17 ENCOUNTER — Encounter (HOSPITAL_BASED_OUTPATIENT_CLINIC_OR_DEPARTMENT_OTHER): Payer: Self-pay | Admitting: Certified Registered"

## 2012-07-17 ENCOUNTER — Encounter (HOSPITAL_BASED_OUTPATIENT_CLINIC_OR_DEPARTMENT_OTHER)
Admission: RE | Disposition: A | Discharge status: Home / Self Care | Payer: Self-pay | Source: Ambulatory Visit | Attending: Orthopedic Surgery

## 2012-07-17 ENCOUNTER — Ambulatory Visit (HOSPITAL_BASED_OUTPATIENT_CLINIC_OR_DEPARTMENT_OTHER)
Admission: RE | Admit: 2012-07-17 | Discharge: 2012-07-17 | Disposition: A | Discharge status: Home / Self Care | Payer: BC Managed Care – PPO | Source: Ambulatory Visit | Attending: Orthopedic Surgery | Admitting: Orthopedic Surgery

## 2012-07-17 ENCOUNTER — Ambulatory Visit (HOSPITAL_BASED_OUTPATIENT_CLINIC_OR_DEPARTMENT_OTHER): Payer: BC Managed Care – PPO | Admitting: Certified Registered"

## 2012-07-17 DIAGNOSIS — IMO0002 Reserved for concepts with insufficient information to code with codable children: Secondary | ICD-10-CM | POA: Insufficient documentation

## 2012-07-17 DIAGNOSIS — Z87891 Personal history of nicotine dependence: Secondary | ICD-10-CM | POA: Insufficient documentation

## 2012-07-17 DIAGNOSIS — Z472 Encounter for removal of internal fixation device: Secondary | ICD-10-CM | POA: Insufficient documentation

## 2012-07-17 DIAGNOSIS — S52202K Unspecified fracture of shaft of left ulna, subsequent encounter for closed fracture with nonunion: Secondary | ICD-10-CM

## 2012-07-17 HISTORY — DX: Personal history of Methicillin resistant Staphylococcus aureus infection: Z86.14

## 2012-07-17 HISTORY — DX: Presence of functional implant, unspecified: Z96.9

## 2012-07-17 HISTORY — PX: HARDWARE REMOVAL: SHX979

## 2012-07-17 HISTORY — DX: Reserved for concepts with insufficient information to code with codable children: IMO0002

## 2012-07-17 HISTORY — PX: ORIF ULNAR FRACTURE: SHX5417

## 2012-07-17 SURGERY — REMOVAL, HARDWARE
Anesthesia: Regional | Site: Arm Lower | Laterality: Left | Wound class: Clean

## 2012-07-17 MED ORDER — ONDANSETRON HCL 4 MG/2ML IJ SOLN
INTRAMUSCULAR | Status: DC | PRN
  Administered 2012-07-17: 4 mg via INTRAVENOUS

## 2012-07-17 MED ORDER — MIDAZOLAM HCL 5 MG/5ML IJ SOLN
INTRAMUSCULAR | Status: DC | PRN
  Administered 2012-07-17: 1 mg via INTRAVENOUS

## 2012-07-17 MED ORDER — MIDAZOLAM HCL 2 MG/2ML IJ SOLN
1.0000 mg | INTRAMUSCULAR | Status: DC | PRN
  Administered 2012-07-17: 2 mg via INTRAVENOUS

## 2012-07-17 MED ORDER — HYDROMORPHONE HCL PF 1 MG/ML IJ SOLN: 0.2500 mg | INTRAMUSCULAR | Status: DC | PRN

## 2012-07-17 MED ORDER — DEXAMETHASONE SODIUM PHOSPHATE 4 MG/ML IJ SOLN
INTRAMUSCULAR | Status: DC | PRN
  Administered 2012-07-17: 10 mg via INTRAVENOUS

## 2012-07-17 MED ORDER — OXYCODONE-ACETAMINOPHEN 5-325 MG PO TABS: 1.0000 | ORAL_TABLET | ORAL | Status: DC | PRN

## 2012-07-17 MED ORDER — BUPIVACAINE HCL (PF) 0.5 % IJ SOLN
INTRAMUSCULAR | Status: DC | PRN
  Administered 2012-07-17: 10 mL

## 2012-07-17 MED ORDER — MIDAZOLAM HCL 2 MG/ML PO SYRP: 12.0000 mg | ORAL_SOLUTION | Freq: Once | ORAL | Status: DC | PRN

## 2012-07-17 MED ORDER — OXYCODONE HCL 5 MG/5ML PO SOLN: 5.0000 mg | Freq: Once | ORAL | Status: DC | PRN

## 2012-07-17 MED ORDER — POVIDONE-IODINE 7.5 % EX SOLN: Freq: Once | CUTANEOUS | Status: DC

## 2012-07-17 MED ORDER — PROPOFOL 10 MG/ML IV BOLUS
INTRAVENOUS | Status: DC | PRN
  Administered 2012-07-17: 300 mg via INTRAVENOUS

## 2012-07-17 MED ORDER — FENTANYL CITRATE 0.05 MG/ML IJ SOLN
50.0000 ug | INTRAMUSCULAR | Status: DC | PRN
  Administered 2012-07-17: 100 ug via INTRAVENOUS

## 2012-07-17 MED ORDER — LIDOCAINE HCL (CARDIAC) 20 MG/ML IV SOLN
INTRAVENOUS | Status: DC | PRN
  Administered 2012-07-17: 30 mg via INTRAVENOUS

## 2012-07-17 MED ORDER — LACTATED RINGERS IV SOLN
INTRAVENOUS | Status: DC
  Administered 2012-07-17 (×3): via INTRAVENOUS

## 2012-07-17 MED ORDER — CEFAZOLIN SODIUM 1-5 GM-% IV SOLN
1.0000 g | Freq: Three times a day (TID) | INTRAVENOUS | Status: DC
  Administered 2012-07-17: 1 g via INTRAVENOUS

## 2012-07-17 MED ORDER — BUPIVACAINE-EPINEPHRINE PF 0.5-1:200000 % IJ SOLN
INTRAMUSCULAR | Status: DC | PRN
  Administered 2012-07-17: 30 mL

## 2012-07-17 MED ORDER — FENTANYL CITRATE 0.05 MG/ML IJ SOLN
INTRAMUSCULAR | Status: DC | PRN
  Administered 2012-07-17 (×4): 25 ug via INTRAVENOUS

## 2012-07-17 MED ORDER — CEFAZOLIN SODIUM-DEXTROSE 2-3 GM-% IV SOLR
2.0000 g | INTRAVENOUS | Status: AC
  Administered 2012-07-17: 2 g via INTRAVENOUS

## 2012-07-17 MED ORDER — OXYCODONE HCL 5 MG PO TABS: 5.0000 mg | ORAL_TABLET | Freq: Once | ORAL | Status: DC | PRN

## 2012-07-17 SURGICAL SUPPLY — 107 items
APL SKNCLS STERI-STRIP NONHPOA (GAUZE/BANDAGES/DRESSINGS) ×1
BANDAGE ELASTIC 3 VELCRO ST LF (GAUZE/BANDAGES/DRESSINGS) ×2 IMPLANT
BANDAGE ELASTIC 4 VELCRO ST LF (GAUZE/BANDAGES/DRESSINGS) ×1 IMPLANT
BANDAGE ELASTIC 6 VELCRO ST LF (GAUZE/BANDAGES/DRESSINGS) IMPLANT
BANDAGE ESMARK 6X9 LF (GAUZE/BANDAGES/DRESSINGS) IMPLANT
BENZOIN TINCTURE PRP APPL 2/3 (GAUZE/BANDAGES/DRESSINGS) ×1 IMPLANT
BIT DRILL SOLID 2.5X110 (BIT) ×3 IMPLANT
BIT DRILL SOLID 3.5X110 (BIT) ×2 IMPLANT
BLADE AVERAGE 25X9 (BLADE) ×1 IMPLANT
BLADE SURG 15 STRL LF DISP TIS (BLADE) ×1 IMPLANT
BLADE SURG 15 STRL SS (BLADE) ×4
BNDG CMPR 9X4 STRL LF SNTH (GAUZE/BANDAGES/DRESSINGS) ×1
BNDG CMPR 9X6 STRL LF SNTH (GAUZE/BANDAGES/DRESSINGS)
BNDG COHESIVE 3X5 TAN STRL LF (GAUZE/BANDAGES/DRESSINGS) ×1 IMPLANT
BNDG COHESIVE 4X5 TAN STRL (GAUZE/BANDAGES/DRESSINGS) IMPLANT
BNDG ESMARK 4X9 LF (GAUZE/BANDAGES/DRESSINGS) ×1 IMPLANT
BNDG ESMARK 6X9 LF (GAUZE/BANDAGES/DRESSINGS)
BONE CHIP PRESERV 5CC PCAN5 (Bone Implant) ×2 IMPLANT
CANISTER SUCTION 1200CC (MISCELLANEOUS) ×1 IMPLANT
CHLORAPREP W/TINT 26ML (MISCELLANEOUS) ×2 IMPLANT
CLOTH BEACON ORANGE TIMEOUT ST (SAFETY) ×2 IMPLANT
COVER MAYO STAND STRL (DRAPES) ×2 IMPLANT
COVER TABLE BACK 60X90 (DRAPES) ×2 IMPLANT
DECANTER SPIKE VIAL GLASS SM (MISCELLANEOUS) IMPLANT
DRAPE C-ARM 42X72 X-RAY (DRAPES) ×3 IMPLANT
DRAPE EXTREMITY T 121X128X90 (DRAPE) ×2 IMPLANT
DRAPE OEC MINIVIEW 54X84 (DRAPES) ×2 IMPLANT
DRAPE SURG 17X23 STRL (DRAPES) ×2 IMPLANT
DRAPE U 20/CS (DRAPES) ×2 IMPLANT
DRAPE U-SHAPE 47X51 STRL (DRAPES) IMPLANT
DRAPE U-SHAPE 76X120 STRL (DRAPES) IMPLANT
DRSG EMULSION OIL 3X3 NADH (GAUZE/BANDAGES/DRESSINGS) ×3 IMPLANT
DRSG PAD ABDOMINAL 8X10 ST (GAUZE/BANDAGES/DRESSINGS) ×1 IMPLANT
ELECT NDL TIP 2.8 STRL (NEEDLE) IMPLANT
ELECT NEEDLE TIP 2.8 STRL (NEEDLE) IMPLANT
ELECT REM PT RETURN 9FT ADLT (ELECTROSURGICAL) ×2
ELECTRODE REM PT RTRN 9FT ADLT (ELECTROSURGICAL) ×1 IMPLANT
GAUZE SPONGE 4X4 16PLY XRAY LF (GAUZE/BANDAGES/DRESSINGS) IMPLANT
GLOVE BIO SURGEON STRL SZ 6.5 (GLOVE) ×2 IMPLANT
GLOVE BIO SURGEON STRL SZ7 (GLOVE) ×3 IMPLANT
GLOVE BIO SURGEON STRL SZ7.5 (GLOVE) ×2 IMPLANT
GLOVE BIOGEL PI IND STRL 7.0 (GLOVE) ×1 IMPLANT
GLOVE BIOGEL PI IND STRL 8 (GLOVE) ×2 IMPLANT
GLOVE BIOGEL PI INDICATOR 7.0 (GLOVE) ×3
GLOVE BIOGEL PI INDICATOR 8 (GLOVE) ×2
GLOVE ECLIPSE 7.5 STRL STRAW (GLOVE) ×3 IMPLANT
GLOVE EPREMIER NITRL EXT CFF L (GLOVE) IMPLANT
GLOVE EXAM NITRILE EXT CFF LRG (GLOVE) ×2 IMPLANT
GOWN BRE IMP PREV XXLGXLNG (GOWN DISPOSABLE) ×1 IMPLANT
GOWN PREVENTION PLUS XLARGE (GOWN DISPOSABLE) ×3 IMPLANT
GOWN PREVENTION PLUS XXLARGE (GOWN DISPOSABLE) ×1 IMPLANT
GRAFT BNE CANC CHIPS 1-8 5CC (Bone Implant) IMPLANT
KIT INFUSE X SMALL 1.4CC (Orthopedic Implant) ×1 IMPLANT
NDL HYPO 25X1 1.5 SAFETY (NEEDLE) IMPLANT
NEEDLE HYPO 22GX1.5 SAFETY (NEEDLE) IMPLANT
NEEDLE HYPO 25X1 1.5 SAFETY (NEEDLE) IMPLANT
NS IRRIG 1000ML POUR BTL (IV SOLUTION) ×2 IMPLANT
PACK ARTHROSCOPY DSU (CUSTOM PROCEDURE TRAY) IMPLANT
PACK BASIN DAY SURGERY FS (CUSTOM PROCEDURE TRAY) ×2 IMPLANT
PAD CAST 3X4 CTTN HI CHSV (CAST SUPPLIES) ×1 IMPLANT
PAD CAST 4YDX4 CTTN HI CHSV (CAST SUPPLIES) IMPLANT
PADDING CAST ABS 4INX4YD NS (CAST SUPPLIES) ×1
PADDING CAST ABS COTTON 4X4 ST (CAST SUPPLIES) ×1 IMPLANT
PADDING CAST COTTON 3X4 STRL (CAST SUPPLIES)
PADDING CAST COTTON 4X4 STRL (CAST SUPPLIES) ×2
PADDING CAST COTTON 6X4 STRL (CAST SUPPLIES) IMPLANT
PENCIL BUTTON HOLSTER BLD 10FT (ELECTRODE) ×2 IMPLANT
PLATE LC DCP 10H 129MM (Plate) ×1 IMPLANT
SCREW CORTEX 3.5 20MM (Screw) ×1 IMPLANT
SCREW CORTEX 3.5 22MM (Screw) ×1 IMPLANT
SCREW CORTEX 3.5 24MM (Screw) ×1 IMPLANT
SCREW CORTEX 3.5 28MM (Screw) ×3 IMPLANT
SCREW LOCK CORT ST 3.5X20 (Screw) IMPLANT
SCREW LOCK CORT ST 3.5X22 (Screw) IMPLANT
SCREW LOCK CORT ST 3.5X24 (Screw) IMPLANT
SCREW LOCK CORT ST 3.5X28 (Screw) IMPLANT
SLEEVE SCD COMPRESS KNEE MED (MISCELLANEOUS) ×1 IMPLANT
SLING ARM FOAM STRAP XLG (SOFTGOODS) ×1 IMPLANT
SPLINT FAST PLASTER 5X30 (CAST SUPPLIES)
SPLINT PLASTER CAST FAST 5X30 (CAST SUPPLIES) IMPLANT
SPLINT PLASTER CAST XFAST 4X15 (CAST SUPPLIES) IMPLANT
SPLINT PLASTER XTRA FAST SET 4 (CAST SUPPLIES)
SPONGE GAUZE 4X4 12PLY (GAUZE/BANDAGES/DRESSINGS) ×2 IMPLANT
SPONGE LAP 4X18 X RAY DECT (DISPOSABLE) ×1 IMPLANT
STAPLER VISISTAT (STAPLE) IMPLANT
STAPLER VISISTAT 35W (STAPLE) ×1 IMPLANT
STOCKINETTE 4X48 STRL (DRAPES) ×2 IMPLANT
STOCKINETTE 6  STRL (DRAPES) ×1
STOCKINETTE 6 STRL (DRAPES) ×1 IMPLANT
STOCKINETTE IMPERVIOUS LG (DRAPES) IMPLANT
STRIP CLOSURE SKIN 1/2X4 (GAUZE/BANDAGES/DRESSINGS) IMPLANT
STRIP CLOSURE SKIN 1/4X4 (GAUZE/BANDAGES/DRESSINGS) ×1 IMPLANT
SUCTION FRAZIER TIP 10 FR DISP (SUCTIONS) IMPLANT
SUT ETHILON 4 0 PS 2 18 (SUTURE) IMPLANT
SUT MON AB 4-0 PC3 18 (SUTURE) IMPLANT
SUT VIC AB 1 CT1 27 (SUTURE) ×2
SUT VIC AB 1 CT1 27XBRD ANBCTR (SUTURE) IMPLANT
SUT VIC AB 2-0 SH 27 (SUTURE) ×4
SUT VIC AB 2-0 SH 27XBRD (SUTURE) ×1 IMPLANT
SUT VIC AB 3-0 FS2 27 (SUTURE) IMPLANT
SUT VICRYL 4-0 PS2 18IN ABS (SUTURE) IMPLANT
SYR BULB 3OZ (MISCELLANEOUS) ×2 IMPLANT
SYR CONTROL 10ML LL (SYRINGE) IMPLANT
TOWEL OR 17X24 6PK STRL BLUE (TOWEL DISPOSABLE) ×2 IMPLANT
TOWEL OR NON WOVEN STRL DISP B (DISPOSABLE) ×2 IMPLANT
TUBE CONNECTING 20X1/4 (TUBING) ×2 IMPLANT
UNDERPAD 30X30 INCONTINENT (UNDERPADS AND DIAPERS) ×2 IMPLANT

## 2012-07-17 NOTE — Anesthesia Postprocedure Evaluation (Signed)
  Anesthesia Post-op Note  Patient: Jermaine Hunt  Procedure(s) Performed: Procedure(s): HARDWARE REMOVAL LEFT ULNAR NON UNION  (Left) OPEN REDUCTION INTERNAL FIXATION (ORIF) LEFT ULNAR NON UNION  (Left)  Patient Location: PACU  Anesthesia Type:GA combined with regional for post-op pain  Level of Consciousness: awake and alert   Airway and Oxygen Therapy: Patient Spontanous Breathing  Post-op Pain: none  Post-op Assessment: Post-op Vital signs reviewed, Patient's Cardiovascular Status Stable, Respiratory Function Stable, Patent Airway, No signs of Nausea or vomiting and Pain level controlled  Post-op Vital Signs: stable  Complications: No apparent anesthesia complications

## 2012-07-17 NOTE — Anesthesia Preprocedure Evaluation (Addendum)
Anesthesia Evaluation  Patient identified by MRN, date of birth, ID band Patient awake    Reviewed: Allergy & Precautions, H&P , NPO status , Patient's Chart, lab work & pertinent test results  Airway Mallampati: II TM Distance: >3 FB Neck ROM: Full    Dental no notable dental hx. (+) Chipped and Dental Advisory Given   Pulmonary neg pulmonary ROS,  breath sounds clear to auscultation  Pulmonary exam normal       Cardiovascular negative cardio ROS  Rhythm:Regular Rate:Normal     Neuro/Psych negative neurological ROS  negative psych ROS   GI/Hepatic negative GI ROS, Neg liver ROS,   Endo/Other  negative endocrine ROS  Renal/GU negative Renal ROS  negative genitourinary   Musculoskeletal   Abdominal   Peds  Hematology negative hematology ROS (+)   Anesthesia Other Findings   Reproductive/Obstetrics negative OB ROS                          Anesthesia Physical Anesthesia Plan  ASA: II  Anesthesia Plan: General and Regional   Post-op Pain Management:    Induction: Intravenous  Airway Management Planned: LMA  Additional Equipment:   Intra-op Plan:   Post-operative Plan: Extubation in OR  Informed Consent: I have reviewed the patients History and Physical, chart, labs and discussed the procedure including the risks, benefits and alternatives for the proposed anesthesia with the patient or authorized representative who has indicated his/her understanding and acceptance.   Dental advisory given  Plan Discussed with: CRNA  Anesthesia Plan Comments:         Anesthesia Quick Evaluation

## 2012-07-17 NOTE — H&P (Signed)
Jermaine Hunt is an 32 y.o. male.   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  Past Medical History  Diagnosis Date  . 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 History of MRSA infection 2013    Past Surgical History  Procedure Laterality Date  . Orif radius & ulna fractures Left 06/15/2009    History reviewed. No pertinent family history. Social History:  reports that he has been smoking Cigarettes.  He has been smoking about 0.00 packs per day for the past 8 years. He has never used smokeless tobacco. He reports that  drinks alcohol. He reports that he does not use illicit drugs.  Allergies:  Allergies  Allergen Reactions  . Nsaids Nausea Only    Pt. To have endoscopy due to GI reaction to NSAIDS  . Sulfa Antibiotics Other (See Comments)    UNKNOWN    Medications Prior to Admission  Medication Sig Dispense Refill  . HYDROcodone-acetaminophen (NORCO/VICODIN) 5-325 MG per tablet Take 1 tablet by mouth every 6 (six) hours as needed for pain.        No results found for this or any previous visit (from the past 48 hour(s)). No results found.  Review of Systems  All other systems reviewed and are negative.    Blood pressure 119/70, pulse 73, temperature 98.3 F (36.8 C), temperature source Oral, resp. rate 16, height 5\' 10"  (1.778 m), weight 129.547 kg (285 lb 9.6 oz), SpO2 98.00%. Physical Exam  Constitutional: He is oriented to person, place, and time. He appears well-developed and well-nourished.  HENT:  Head: Atraumatic.  Eyes: EOM are normal.  Cardiovascular: Intact distal pulses.   Respiratory: Effort normal.  Musculoskeletal:       Left wrist: He exhibits tenderness.  Neurological: He is alert and oriented to person, place, and time.  Skin: Skin is warm and dry.  Psychiatric: He has a normal mood and affect.     Assessment/Plan L ulnar nonunion Plan revision ORIF with  BMP Risks / benefits of surgery discussed Consent on chart  NPO for OR Preop antibiotics   Kimyah Frein WILLIAM 07/17/2012, 12:46 PM

## 2012-07-17 NOTE — Op Note (Signed)
Procedure(s): HARDWARE REMOVAL LEFT ULNAR NON UNION  OPEN REDUCTION INTERNAL FIXATION (ORIF) LEFT ULNAR NON UNION  Procedure Note  Jermaine Hunt male 32 y.o. 07/17/2012  Procedure(s) and Anesthesia Type:    * HARDWARE REMOVAL LEFT ULNAR NON UNION  - General    * OPEN REDUCTION INTERNAL FIXATION (ORIF) LEFT ULNAR NON UNION  - General  Surgeon(s) and Role:    * Mable Paris, MD - Primary   Indications:  32 y.o. male s/p ORIF left both bone forearm fracture with nonunion of the ulna. He was indicated for hardware removal and revision fixation of the ulna with bone grafting and BMP to attempt to get the ulna to heal and decrease his pain. He has quit smoking and has agreed to not smoke going forward.     Surgeon: Mable Paris   Assistants: Damita Lack PA-C [facility] was present and scrubbed throughout the procedure and was essential in positioning, retraction, exposure, and closure)  Anesthesia: General endotracheal anesthesia with preoperative regional block given by the attending anesthesiologist    Procedure Detail  HARDWARE REMOVAL LEFT ULNAR NON UNION , OPEN REDUCTION INTERNAL FIXATION (ORIF) LEFT ULNAR NON UNION   Findings: The nonunion was taken down. The plate was removed with all the screws. The intramedullary canal was reestablished on both sides. In the 10 hole plate was placed on the opposite cortex with compression at the fracture. Allograft bone chips were placed around the fracture site as well as an extra small infuse BMP sponge.  Estimated Blood Loss:  less than 50 mL         Drains: none  Blood Given: none         Specimens: none        Complications:  * No complications entered in OR log *         Disposition: PACU - hemodynamically stable.         Condition: stable  Tourniquet time: 1 hour 57 minutes at 250 mm mercury    Procedure: The patient was identified in the preoperative holding area where I personally marked  the operative site after verifying site side and procedure with the patient. He had a preoperative regional block given by the attending anesthesiologist. He was taken back to the operating room where general anesthesia was induced without complication. He was kept in the supine position with the operative arm on a hand table. A nonsterile tourniquet was applied to the upper arm and the left upper extremity was then prepped and draped in standard sterile fashion. The limb was exsanguinated using an Esmarch dressing after he had 2 g IV Ancef. The tourniquet was elevated to 250 mm mercury. His previously marked incision was incised extending it proximally and distally a few centimeters. Dissection was carried down to the ulnar border of the ulna and the overgrown plate was identified. Rongeur and osteotomes were used to remove the excess bone over the plate and all the screws were backed out. The plate was removed. The ronguer was used to smooth the site of the previous plate and remove any excess tissue. Attention was then turned to the nonunion site which was highly mobile and clearly ununited. Small rongeur was used to clear the nonunion site. The nonunion was completely exposed circumferentially. On the radial aspect of the ulna there was noted to be some excess cortical bone that was hindering a compressive reduction and was removed with a small saw. A 3.5 mm drill was used to reestablished  intramedullary canal on both sides. Rongeur was used to get down to bleeding bone on both sides of fracture. Fracture was then held reduced while a 10 hole plate was laid over the opposing cortex where the previous plate was removed from. The plate was pre-bent to fit the cortex and also excessively bent to allow compression. It was then affixed across the fracture with 2 compression screws. Initially good compression was noted that when the final screws a plate were applied there was a slight gap at the fracture. Therefore  the plate was removed and additional concave bending was applied. The plate was again applied and compression and at this point it was noted to have good compression across the fracture with good contact of the bone ends. The cancellus chips were then packed around the fracture site and this extra small BMP sponge was then laid over the fracture and bone graft. #1 Vicryl was used to secure the fascia over top of the BMP to ensure its security. Final x-rays were then taken which showed anatomic reduction with adequate compression and appropriate screw size and plate size. The 3 distal and 3 proximal screws and the plate were used to stay far from the fracture site and allow some micromotion. The wound was then closed with 2-0 Vicryl in a deep dermal layer and staples for skin closure. Sterile dressing was then applied as well as a posterior splint in neutral position. The tourniquet was let down during the procedure at one hour and 57 minutes and bleeding was controlled. The patient was not to awaken from general anesthesia transferred to the stretcher and taken to the recovery room in stable condition.   POSTOPERATIVE PLAN: He will be discharged home with his family today as long as he is comfortable in the recovery room. He will followup in 2 weeks for wound check and we'll transition to a removable posterior splint to allow some early motion.

## 2012-07-17 NOTE — Progress Notes (Signed)
Assisted Dr. Fitzgerald with left, ultrasound guided, supraclavicular block. Side rails up, monitors on throughout procedure. See vital signs in flow sheet. Tolerated Procedure well. 

## 2012-07-17 NOTE — Anesthesia Procedure Notes (Addendum)
Anesthesia Regional Block:  Supraclavicular block  Pre-Anesthetic Checklist: ,, timeout performed, Correct Patient, Correct Site, Correct Laterality, Correct Procedure, Correct Position, site marked, Risks and benefits discussed, pre-op evaluation, post-op pain management  Laterality: Left  Prep: Maximum Sterile Barrier Precautions used and chloraprep       Needles:  Injection technique: Single-shot  Needle Type: Echogenic Stimulator Needle     Needle Length: 5cm 5 cm Needle Gauge: 22 and 22 G    Additional Needles:  Procedures: ultrasound guided (picture in chart) Supraclavicular block Narrative:  Start time: 07/17/2012 11:28 AM End time: 07/17/2012 11:40 AM Injection made incrementally with aspirations every 5 mL. Anesthesiologist: Fitzgerald,MD  Additional Notes: 2% Lidocaine skin wheel. Intercostobrachial block with 5cc of 0.5% Bupivicaine plain.  Supraclavicular block Procedure Name: LMA Insertion Date/Time: 07/17/2012 12:57 PM Performed by: Britton Bera D Pre-anesthesia Checklist: Patient identified, Emergency Drugs available, Suction available and Patient being monitored Patient Re-evaluated:Patient Re-evaluated prior to inductionOxygen Delivery Method: Circle System Utilized Preoxygenation: Pre-oxygenation with 100% oxygen Intubation Type: IV induction Ventilation: Mask ventilation without difficulty LMA: LMA inserted LMA Size: 5.0 Number of attempts: 1 Airway Equipment and Method: bite block Placement Confirmation: positive ETCO2 Tube secured with: Tape Dental Injury: Teeth and Oropharynx as per pre-operative assessment

## 2012-07-17 NOTE — Transfer of Care (Signed)
Immediate Anesthesia Transfer of Care Note  Patient: Jermaine Hunt  Procedure(s) Performed: Procedure(s): HARDWARE REMOVAL LEFT ULNAR NON UNION  (Left) OPEN REDUCTION INTERNAL FIXATION (ORIF) LEFT ULNAR NON UNION  (Left)  Patient Location: PACU  Anesthesia Type:GA combined with regional for post-op pain  Level of Consciousness: awake, alert , oriented and patient cooperative  Airway & Oxygen Therapy: Patient Spontanous Breathing and Patient connected to face mask oxygen  Post-op Assessment: Report given to PACU RN and Post -op Vital signs reviewed and stable  Post vital signs: Reviewed and stable  Complications: No apparent anesthesia complications

## 2012-07-19 ENCOUNTER — Encounter (HOSPITAL_BASED_OUTPATIENT_CLINIC_OR_DEPARTMENT_OTHER): Payer: Self-pay | Admitting: Orthopedic Surgery

## 2012-08-25 ENCOUNTER — Emergency Department (HOSPITAL_COMMUNITY)
Admission: EM | Admit: 2012-08-25 | Discharge: 2012-08-25 | Disposition: A | Discharge status: Home / Self Care | Payer: BC Managed Care – PPO | Attending: Emergency Medicine | Admitting: Emergency Medicine

## 2012-08-25 ENCOUNTER — Encounter (HOSPITAL_COMMUNITY): Payer: Self-pay | Admitting: *Deleted

## 2012-08-25 DIAGNOSIS — L03019 Cellulitis of unspecified finger: Secondary | ICD-10-CM | POA: Insufficient documentation

## 2012-08-25 DIAGNOSIS — L089 Local infection of the skin and subcutaneous tissue, unspecified: Secondary | ICD-10-CM

## 2012-08-25 DIAGNOSIS — Z8614 Personal history of Methicillin resistant Staphylococcus aureus infection: Secondary | ICD-10-CM | POA: Insufficient documentation

## 2012-08-25 DIAGNOSIS — F17200 Nicotine dependence, unspecified, uncomplicated: Secondary | ICD-10-CM | POA: Insufficient documentation

## 2012-08-25 DIAGNOSIS — L02519 Cutaneous abscess of unspecified hand: Secondary | ICD-10-CM | POA: Insufficient documentation

## 2012-08-25 MED ORDER — DOXYCYCLINE HYCLATE 100 MG PO CAPS: 100.0000 mg | ORAL_CAPSULE | Freq: Two times a day (BID) | ORAL | Status: DC

## 2012-08-25 MED ORDER — MORPHINE SULFATE 4 MG/ML IJ SOLN
4.0000 mg | Freq: Once | INTRAMUSCULAR | Status: AC
  Administered 2012-08-25: 4 mg via INTRAVENOUS

## 2012-08-25 MED ORDER — DOXYCYCLINE HYCLATE 100 MG PO TABS
100.0000 mg | ORAL_TABLET | Freq: Once | ORAL | Status: AC
  Administered 2012-08-25: 100 mg via ORAL

## 2012-08-25 MED ORDER — OXYCODONE-ACETAMINOPHEN 5-325 MG PO TABS: ORAL_TABLET | ORAL | Status: DC

## 2012-08-25 MED ORDER — HYDROMORPHONE HCL PF 1 MG/ML IJ SOLN
0.5000 mg | Freq: Once | INTRAMUSCULAR | Status: AC
  Administered 2012-08-25: 0.5 mg via INTRAVENOUS
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
## 2012-08-25 NOTE — ED Notes (Signed)
Pt reports L middle finger pain and swelling.  Redness is noted.

## 2012-08-26 NOTE — ED Provider Notes (Signed)
Medical screening examination/treatment/procedure(s) were performed by non-physician practitioner and as supervising physician I was immediately available for consultation/collaboration.  Khale Nigh M Steffon Gladu, MD 08/26/12 0620 

## 2013-03-28 ENCOUNTER — Encounter (HOSPITAL_COMMUNITY): Payer: Self-pay | Admitting: Emergency Medicine

## 2013-03-28 ENCOUNTER — Emergency Department (HOSPITAL_COMMUNITY): Payer: No Typology Code available for payment source

## 2013-03-28 ENCOUNTER — Emergency Department (HOSPITAL_COMMUNITY)
Admission: EM | Admit: 2013-03-28 | Discharge: 2013-03-28 | Discharge status: Against Medical Advice | Payer: No Typology Code available for payment source | Attending: Emergency Medicine | Admitting: Emergency Medicine

## 2013-03-28 DIAGNOSIS — Z8781 Personal history of (healed) traumatic fracture: Secondary | ICD-10-CM | POA: Insufficient documentation

## 2013-03-28 DIAGNOSIS — Z9889 Other specified postprocedural states: Secondary | ICD-10-CM | POA: Insufficient documentation

## 2013-03-28 DIAGNOSIS — M79632 Pain in left forearm: Secondary | ICD-10-CM

## 2013-03-28 DIAGNOSIS — M7989 Other specified soft tissue disorders: Secondary | ICD-10-CM | POA: Insufficient documentation

## 2013-03-28 DIAGNOSIS — M25539 Pain in unspecified wrist: Secondary | ICD-10-CM | POA: Insufficient documentation

## 2013-03-28 DIAGNOSIS — Z8614 Personal history of Methicillin resistant Staphylococcus aureus infection: Secondary | ICD-10-CM | POA: Insufficient documentation

## 2013-03-28 DIAGNOSIS — R209 Unspecified disturbances of skin sensation: Secondary | ICD-10-CM | POA: Insufficient documentation

## 2013-03-28 DIAGNOSIS — F17200 Nicotine dependence, unspecified, uncomplicated: Secondary | ICD-10-CM | POA: Insufficient documentation

## 2013-03-28 MED ORDER — HYDROCODONE-ACETAMINOPHEN 5-325 MG PO TABS: 1.0000 | ORAL_TABLET | Freq: Once | ORAL | Status: DC

## 2013-03-28 MED ORDER — DOXYCYCLINE HYCLATE 100 MG PO CAPS: 100.0000 mg | ORAL_CAPSULE | Freq: Two times a day (BID) | ORAL | Status: DC

## 2013-03-28 MED ORDER — HYDROCODONE-HOMATROPINE 5-1.5 MG/5ML PO SYRP: 5.0000 mL | ORAL_SOLUTION | Freq: Four times a day (QID) | ORAL | Status: DC | PRN

## 2013-03-28 NOTE — ED Provider Notes (Signed)
Medical screening examination/treatment/procedure(s) were performed by non-physician practitioner and as supervising physician I was immediately available for consultation/collaboration.  EKG Interpretation   None         William Keiasha Diep, MD 03/28/13 2306 
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
## 2013-03-28 NOTE — Discharge Instructions (Signed)
°Emergency Department Resource Guide °1) Find a Doctor and Pay Out of Pocket °Although you won't have to find out who is covered by your insurance plan, it is a good idea to ask around and get recommendations. You will then need to call the office and see if the doctor you have chosen will accept you as a new patient and what types of options they offer for patients who are self-pay. Some doctors offer discounts or will set up payment plans for their patients who do not have insurance, but you will need to ask so you aren't surprised when you get to your appointment. ° °2) Contact Your Local Health Department °Not all health departments have doctors that can see patients for sick visits, but many do, so it is worth a call to see if yours does. If you don't know where your local health department is, you can check in your phone book. The CDC also has a tool to help you locate your state's health department, and many state websites also have listings of all of their local health departments. ° °3) Find a Walk-in Clinic °If your illness is not likely to be very severe or complicated, you may want to try a walk in clinic. These are popping up all over the country in pharmacies, drugstores, and shopping centers. They're usually staffed by nurse practitioners or physician assistants that have been trained to treat common illnesses and complaints. They're usually fairly quick and inexpensive. However, if you have serious medical issues or chronic medical problems, these are probably not your best option. ° °No Primary Care Doctor: °- Call [facility] at  832-8000 - they can help you locate a primary care doctor that  accepts your insurance, provides certain services, etc. °- Physician Referral Service- 1-800-533-3463 ° °Chronic Pain Problems: °Organization         Address  Phone   Notes  ° [facility]  (336) 297-2271 Patients need to be referred by their primary care doctor.  ° °Medication  Assistance: °Organization         Address  Phone   Notes  °[place] [facility] [address] °[place], [place] [address] (336) 641-8030 --Must be a resident of [place] °-- Must have NO insurance coverage whatsoever (no Medicaid/ Medicare, etc.) °-- The pt. MUST have a primary care doctor that directs their care regularly and follows them in the community °  °[facility]  (866) 331-1348   °[employer]  (888) 892-1162   ° °Agencies that provide inexpensive medical care: °Organization         Address  Phone   Notes  °[facility]  (336) 832-8035   °[facility]    (336) 832-7272   °[facility] [address] °[place], [place] [address] (336) 832-4777   °[facility] [address], °[place] (336) 271-4999   °[facility]    (336) 373-0678   °[facility]    (336) 272-1050   °[facility] ° [address], [place] Phone:  (336) 832-4444, Fax:  (336) 832-4440 Hours of Operation:  9 am - 6 pm, M-F.  Also accepts Medicaid/Medicare and self-pay.  °[facility] ° [address], [place] Phone: (336) 832-3150, Fax: (336) [phone]. Hours of Operation:  8:30 am - 5:30 pm, M-F.  Also accepts Medicaid and self-pay.  °[facility] [place] [address], [place] Phone: (336) 878-6027   °[facility] [address], [place], [place] (336)723-1848, Ext. 123 Mondays & Thursdays: 7-9 AM.  First 15 patients are seen on a first come, first serve basis. °  ° °Medicaid-accepting [place] Providers: ° °Organization         Address  Phone   Notes  °[facility] [address], [place] (336) 641-2100 Also accepts self-pay patients.  °[facility] [address], [place] ° (336) 856-9996   °[facility] [address], [place]  (336) 288-8857   °[facility] [address] [place] [address], [place] (336) 299-7000   °Veita Bland [address], [place]  ° (336) 373-1557 Only accepts  Access Medicaid patients after they have their name applied to their card.  ° °Self-Pay (no insurance) in [place]: ° °Organization         Address  Phone   Notes  °Sickle Cell Patients, [facility] [address], [place] (336) 832-1970   °[facility] [address], [place] (336) 832-4400   °[facility] [place] ° [address], [place] (336) 992-4800   °[facility]/Dr. Osei-Bonsu ° [address] [place] [address], [place] or [address], [place] (336) 841-8500 Phone number for both [place] and [place] locations is the same.  °[facility] [address], [place] (336) 299-0000   °[facility] [address] [place] [address], [place] or [address] (336) 852-7530 °(336) 878-2260   °[facility] [address], [place] (336) 350-1642, phone; (336) 294-5005, fax Sees patients 1st and 3rd Saturday of every month.  Must not qualify for public or private insurance (i.e. Medicaid, Medicare, [place] Health Choice, Veterans' Benefits) • Household income should be no more than 200% of the poverty level •The clinic cannot treat you if you are pregnant or think you are pregnant • Sexually transmitted diseases are not treated at the clinic.  ° ° °Dental Care: °Organization         Address  Phone  Notes  °[place] [facility] [address], [place] (336) 641-6152 Accepts children up to age 21 who are enrolled in Medicaid or [place] Health Choice; pregnant women with a Medicaid card; and children who have applied for Medicaid or [place] Health Choice, but were declined, whose parents can pay a reduced fee at time of service.  °[place]  [facility] [place]  [address], [place] (336) 641-7733 Accepts children up to age 21 who are enrolled in Medicaid or [place] Health Choice; pregnant women with a Medicaid card; and children who have applied for Medicaid or [place] Health Choice, but were declined, whose parents can pay a reduced fee at time of service.  °[facility] ° [address], [place] (336) 641-4533 Patients are seen by appointment only. Walk-ins are not accepted. [facility] will see patients 18 years of age and older. °Monday - Tuesday (8am-5pm) °Most Wednesdays (8:30-5pm) °$30 per visit, cash only  °[facility] ° [address], [place] (336) 641-4533 Patients are seen by appointment only. Walk-ins are not accepted. [facility] will see patients 18 years of age and older. °One   Wednesday Evening (Monthly: Volunteer Based).  $30 per visit, cash only  °[facility]  (919) 537-3737 for adults; Children under age 4, call Graduate Pediatric Dentistry at ([phone]) [phone]. Children aged 4-14, please call (919) [phone] to request a pediatric application. ° Dental services are provided in all areas of dental care including fillings, crowns and bridges, complete and partial dentures, implants, gum treatment, root canals, and extractions. Preventive care is also provided. Treatment is provided to both adults and children. °Patients are selected via a lottery and there is often a waiting list. °  °[facility] [address], °[place] ° (336) 763-8833 [email or web address] °  °[facility] [address], [place], [place] (336)723-1848, Ext. 123 Second and Fourth Thursday of each month, opens at 6:30 AM; Clinic ends at 9 AM.  Patients are seen on a first-come first-served basis, and a limited number are seen during each clinic.  ° °[facility] ° [address], [place], [place] (336) 723-7904    Eligibility Requirements °You must have lived in [place], [place], or [place] counties for at least the last three months. °  You cannot be eligible for state or federal sponsored healthcare insurance, including Veterans Administration, Medicaid, or Medicare. °  You generally cannot be eligible for healthcare insurance through your employer.  °  How to apply: °Eligibility screenings are held every Tuesday and Wednesday afternoon from 1:00 pm until 4:00 pm. You do not need an appointment for the interview!  °[facility] [address], [place], [place] 336-631-2330   °[facility]  336-342-8273   °[facility]  336-703-3100   °[facility]  336-570-6415   ° °Behavioral Health Resources in the Community: °Intensive Outpatient Programs °Organization         Address  Phone  Notes  °[facility] [address], [place], [place] 336-878-6098   °[facility] [address], [place], [place] 336-832-9800   °[facility] [address], [place], [place] ° 336-882-2125   °[facility] [address],  °[place], [place] 1-800-853-5163 or 336-641-4981   °Substance Abuse Resources °Organization         Address  Phone  Notes  °[facility]  336-882-2125   °[facility]  336-784-9470   °[facility]  336-285-9073   °[facility]  336-845-3988   °Residential & Outpatient Substance Abuse Program  1-800-659-3381   °Psychological Services °Organization         Address  Phone  Notes  °[facility]  336- 832-9600   °[facility]  336- 378-7881   °[facility] [address], [place] 1-800-853-5163 or 336-641-4981   ° °Mobile Crisis Teams °Organization         Address  Phone  Notes  °[facility], Mobile Crisis Care Unit  1-877-626-1772   °Assertive °[facility] ° [address]  [place], [place] 336-834-9664   °Sharon DeEsch [address] °[place] 336-554-5454   ° °Self-Help/Support Groups °Organization         Address  Phone             Notes  °[facility] [place] - variety of support groups  336- 373-1402 Call for more information  °Narcotics Anonymous (NA), [facility] [address], °[place]  2 meetings at this location  ° °  Residential Treatment Programs °Organization         Address  Phone  Notes  °[facility] [address],    °[place]  1-866-801-8205   °[facility] ° [address], [place], [place] 704-293-8524   °[facility] [address], [place] 336-845-3988 Admissions: 8am-3pm M-F  °[facility] [address]    °[place], [place] 336-841-1104   °[facility] [address], [place], [place] 336-379-7146   °[facility] [address]  °[place], [place] 336-285-9073   °[facility] - Intensive Outpatient [address], [place], [place] 336-852-3033   °[facility] [address]  °[place], [place] 1-877-615-2722 or 336-784-9470   °[facility] [address] [place], [place] 336-227-7417 Accepts Medicaid  °[facility] [address]  °[place] 1-800-659-3381 Substance Abuse/Addiction Treatment  ° °[place] Behavioral Health Resources °Organization         Address  Phone  Notes  °[facility]  (888) 581-9988   °Julie Brannon, PhD [address] [place], [place]   (336) 349-5553 or (336) 951-0000   °[facility]   [address] °[place], [place] (336) 349-4454   °[facility] [address], [place], [place] (336) 342-8316 Insurance/Medicaid/sponsorship through [facility]  °[facility] [address]                                    [place], [place] (336) 342-8316 Therapy/tele-psych/case    °[facility] [address]  ° [place], [place] (336) 349-2233    °Dr. Arfeen  (336) 349-4544   °[facility] [place]  [employer] [place] [facility] 1) [address], [place] °2) [address], [place] °3)  [address], [place] (336) 349-3220 °(336) 342-7768 ° °(336) 342-8140   °[place] Child Abuse Hotline (336) 342-1394 or (336) 342-3537 (After Hours)    ° ° °
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
## 2013-03-28 NOTE — ED Notes (Signed)
Dr. Gwendolyn GrantWalden went in to see pt, but pt was not in room.
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
## 2013-03-28 NOTE — ED Notes (Signed)
Pt still not in room. 

## 2013-04-08 ENCOUNTER — Emergency Department (HOSPITAL_COMMUNITY)
Admission: EM | Admit: 2013-04-08 | Discharge: 2013-04-08 | Discharge status: Absent without leave | Payer: No Typology Code available for payment source | Source: Home / Self Care

## 2013-04-09 ENCOUNTER — Emergency Department (INDEPENDENT_AMBULATORY_CARE_PROVIDER_SITE_OTHER): Payer: No Typology Code available for payment source

## 2013-04-09 ENCOUNTER — Encounter (HOSPITAL_COMMUNITY): Payer: Self-pay | Admitting: Emergency Medicine

## 2013-04-09 ENCOUNTER — Emergency Department (INDEPENDENT_AMBULATORY_CARE_PROVIDER_SITE_OTHER)
Admission: EM | Admit: 2013-04-09 | Discharge: 2013-04-09 | Disposition: A | Discharge status: Home / Self Care | Payer: No Typology Code available for payment source | Source: Home / Self Care | Attending: Emergency Medicine | Admitting: Emergency Medicine

## 2013-04-09 DIAGNOSIS — IMO0002 Reserved for concepts with insufficient information to code with codable children: Secondary | ICD-10-CM

## 2013-04-09 DIAGNOSIS — S52202K Unspecified fracture of shaft of left ulna, subsequent encounter for closed fracture with nonunion: Secondary | ICD-10-CM

## 2013-04-09 MED ORDER — HYDROCODONE-ACETAMINOPHEN 5-325 MG PO TABS: 2.0000 | ORAL_TABLET | Freq: Once | ORAL | Status: DC

## 2013-04-09 MED ORDER — OXYCODONE-ACETAMINOPHEN 5-325 MG PO TABS: ORAL_TABLET | ORAL | Status: DC

## 2013-04-09 NOTE — Discharge Instructions (Signed)
Fracture A fracture is a break in a bone, due to a force on the bone that is greater than the bone's strength can handle. There are many types of fractures, including:  Complete fracture: The break passes completely through the bone.  Displaced: The ends of the bone fragments are not properly aligned.  Non-displaced: The ends of the bone fragments are in proper alignment.  Incomplete fracture (greenstick): The break does not pass completely through the bone. Incomplete fractures may or may not be angular (angulated).  Open fracture (compound): Part of the broken bone pokes through the skin. Open fractures have a high risk for infection.  Closed fracture: The fracture has not broken through the skin.  Comminuted fracture: The bone is broken into more than two pieces.  Compression fracture: The break occurs from extreme pressure on the bone (includes crushing injury).  Impacted fracture: The broken bone ends have been driven into each other.  Avulsion fracture: A ligament or tendon pulls a small piece of bone off from the main bony segment.  Pathologic fracture: A fracture due to the bone being made weak by a disease (osteoporosis or tumors).  Stress fracture: A fracture caused by intense exercise or repetitive and prolonged pressure that makes the bone weak. SYMPTOMS   Pain, tenderness, bleeding, bruising, and swelling at the fracture site.  Weakness and inability to bear weight on the injured extremity.  Paleness and deformity (sometimes).  Loss of pulse, numbness, tingling, or paralysis below the fracture site (usually a limb); these are emergencies. CAUSES  Bone being subjected to a force greater than its strength. RISK INCREASES WITH:  Contact sports and falls from heights.  Previous or current bone problems (osteoporosis or tumors).  Poor balance.  Poor strength and flexibility. PREVENTION   Warm up and stretch properly before activity.  Maintain physical  fitness:  Cardiovascular fitness.  Muscle strength.  Flexibility and endurance.  Wear proper protective equipment.  Use proper exercise technique. RELATED COMPLICATIONS   Bone fails to heal (nonunion).  Bone heals in a poor position (malunion).  Low blood volume (hypovolemic), shock due to blood loss.  Clump of fat cells travels through the blood (fat embolus) from the injury site to the lungs or brain (more common with thigh fractures).  Obstruction of nearby arteries. TREATMENT  Treatment first requires realigning of the bones (reduction) by a medically trained person, if the fracture is displaced. After realignment if the fracture is completed, or for non-displaced fractures, ice and medicine are used to reduce pain and inflammation. The bone and adjacent joints are then restrained with a splint, cast, or brace to allow the bones to heal without moving. Surgery is sometimes needed, to reposition the bones and hold the position with rods, pins, plates, or screws. Restraint for long periods of time may result in muscle and joint weakness or build up of fluid in tissues (edema). For this reason, physical therapy is often needed to regain strength and full range of motion. Recovery is complete when there is no bone motion at the fracture site and x-rays (radiographs) show complete healing.  MEDICATION   General anesthesia, sedation, or muscle relaxants may be needed to allow for realignment of the fracture. If pain medicine is needed, nonsteroidal anti-inflammatory medicines (aspirin and ibuprofen), or other minor pain relievers (acetaminophen), are often advised.  Do not take pain medicine for 7 days before surgery.  Stronger pain relievers may be prescribed by your caregiver. Use only as directed and only as much   as you need. SEEK MEDICAL CARE IF:   The following occur after restraint or surgery. (Report any of these signs immediately):  Swelling above or below the fracture  site.  Severe, persistent pain.  Blue or gray skin below the fracture site, especially under the nails. Numbness or loss of feeling below the fracture site. Document Released: 01/24/2005 Document Revised: 01/11/2012 Document Reviewed: 05/08/2008 [employer] Patient Information 2014 [employer].  

## 2013-04-09 NOTE — ED Notes (Signed)
Pt declined medication does not have anyone to drive him.  Mw,cma
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
## 2013-04-09 NOTE — ED Notes (Signed)
Reports pain in left arm.  Hx of surgery x 2 with plate replacement in 2011 and June 2014.   States"woke this morning unable to lift arm".  States "think I went back to work to soon".

## 2013-08-13 ENCOUNTER — Emergency Department (HOSPITAL_COMMUNITY)
Admission: EM | Admit: 2013-08-13 | Discharge: 2013-08-13 | Discharge status: Against Medical Advice | Payer: No Typology Code available for payment source | Attending: Emergency Medicine | Admitting: Emergency Medicine

## 2013-08-13 ENCOUNTER — Encounter (HOSPITAL_COMMUNITY): Payer: Self-pay | Admitting: Emergency Medicine

## 2013-08-13 DIAGNOSIS — M79609 Pain in unspecified limb: Secondary | ICD-10-CM | POA: Insufficient documentation

## 2013-08-13 DIAGNOSIS — F17200 Nicotine dependence, unspecified, uncomplicated: Secondary | ICD-10-CM | POA: Insufficient documentation

## 2013-08-13 NOTE — ED Notes (Signed)
Pt called treatment room x 2 -no answer
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
## 2013-08-13 NOTE — ED Notes (Signed)
Called a third time- No response

## 2013-08-21 ENCOUNTER — Emergency Department (HOSPITAL_COMMUNITY): Payer: No Typology Code available for payment source

## 2013-08-21 ENCOUNTER — Encounter (HOSPITAL_COMMUNITY): Payer: Self-pay | Admitting: Emergency Medicine

## 2013-08-21 ENCOUNTER — Emergency Department (HOSPITAL_COMMUNITY)
Admission: EM | Admit: 2013-08-21 | Discharge: 2013-08-21 | Disposition: A | Discharge status: Home / Self Care | Payer: No Typology Code available for payment source | Attending: Emergency Medicine | Admitting: Emergency Medicine

## 2013-08-21 DIAGNOSIS — Z9889 Other specified postprocedural states: Secondary | ICD-10-CM | POA: Insufficient documentation

## 2013-08-21 DIAGNOSIS — M25532 Pain in left wrist: Secondary | ICD-10-CM

## 2013-08-21 DIAGNOSIS — G8929 Other chronic pain: Secondary | ICD-10-CM | POA: Insufficient documentation

## 2013-08-21 DIAGNOSIS — G8911 Acute pain due to trauma: Secondary | ICD-10-CM | POA: Insufficient documentation

## 2013-08-21 DIAGNOSIS — Z8614 Personal history of Methicillin resistant Staphylococcus aureus infection: Secondary | ICD-10-CM | POA: Insufficient documentation

## 2013-08-21 DIAGNOSIS — M25539 Pain in unspecified wrist: Secondary | ICD-10-CM | POA: Insufficient documentation

## 2013-08-21 DIAGNOSIS — M25559 Pain in unspecified hip: Secondary | ICD-10-CM | POA: Insufficient documentation

## 2013-08-21 DIAGNOSIS — F17200 Nicotine dependence, unspecified, uncomplicated: Secondary | ICD-10-CM | POA: Insufficient documentation

## 2013-08-21 DIAGNOSIS — Z8781 Personal history of (healed) traumatic fracture: Secondary | ICD-10-CM | POA: Insufficient documentation

## 2013-08-21 MED ORDER — OXYCODONE-ACETAMINOPHEN 5-325 MG PO TABS: 1.0000 | ORAL_TABLET | Freq: Four times a day (QID) | ORAL | Status: DC | PRN

## 2013-08-21 MED ORDER — TRAMADOL HCL 50 MG PO TABS
50.0000 mg | ORAL_TABLET | Freq: Once | ORAL | Status: DC
  Filled 2013-08-21: qty 1

## 2013-08-21 NOTE — ED Notes (Signed)
Pt states that he was seen at urgent care in march and told the plates in his left forearm looked displaced, states he has been unable to see a surgeon since that time and symptoms continue to worsen.

## 2013-08-21 NOTE — ED Provider Notes (Signed)
CSN: [id number]     Arrival date & time 08/21/13  1043 History   First MD Initiated Contact with Patient 08/21/13 1107     Chief Complaint  Patient presents with  . Numbness     (Consider location/radiation/quality/duration/timing/severity/associated sxs/prior Treatment) HPI  Patient presents to the emergency department for evaluation of his chronic left forearm pain from his elbow to his hand ever since an injury since 2011. He is also here to be evaluated for his left thigh numbness and pain. The patient had a fracture to the left forearm and since then has had intermittent pain and numbness. He reports being seen at an urgent care and referred to surgery after the x-ray at that time showed his hardware had mildly displaced. He never followed up and is unsure of what happened with his referral. At that time he went to the urgent care a few months ago he was having this left leg pain. Today he has had the left leg pain for 2 days now and is concerned it may be related to the plates in his arm. He has not had any weakness, new injuries, rash, fevers, swelling of his arm or his leg. Patient would like an x-ray of his arm and referral to the surgeon.  Past Medical History  Diagnosis Date  . 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 History of MRSA infection 2013   Past Surgical History  Procedure Laterality Date  . Orif radius & ulna fractures Left 06/15/2009  . Hardware removal Left 07/17/2012    Procedure: HARDWARE REMOVAL LEFT ULNAR NON UNION ;  Surgeon: Mable ParisJustin William Chandler, MD;  Location: [facility];  Service: Orthopedics;  Laterality: Left;  . Orif ulnar fracture Left 07/17/2012    Procedure: OPEN REDUCTION INTERNAL FIXATION (ORIF) LEFT ULNAR NON UNION ;  Surgeon: Mable ParisJustin William Chandler, MD;  Location: [facility];  Service: Orthopedics;  Laterality: Left;   History reviewed. No pertinent family  history. History  Substance Use Topics  . Smoking status: Current Every Day Smoker -- 8 years    Types: Cigarettes  . Smokeless tobacco: Never Used     Comment: 4 cig./day  . Alcohol Use: Yes     Comment: socially    Review of Systems   Review of Systems  Gen: no weight loss, fevers, chills, night sweats  Eyes: no discharge or drainage, no occular pain or visual changes  Nose: no epistaxis or rhinorrhea  Mouth: no dental pain, no sore throat  Neck: no neck pain  Lungs:No wheezing, coughing or hemoptysis CV: no chest pain, palpitations, dependent edema or orthopnea  Abd: no abdominal pain, nausea, vomiting, diarrhea GU: no dysuria or gross hematuria  MSK:  No muscle weakness, + left forearm and thigh numbness/pain Neuro: no headache, no focal neurologic deficits  Skin: no rash or wounds Psyche: no complaints    Allergies  Nsaids and Sulfa antibiotics  Home Medications   Prior to Admission medications   Medication Sig Start Date End Date Taking? Authorizing Provider  oxyCODONE-acetaminophen (PERCOCET/ROXICET) 5-325 MG per tablet Take 1-2 tablets by mouth every 6 (six) hours as needed for severe pain. 08/21/13   Liane Tribbey Irine SealG Castulo Scarpelli, PA-C   BP 119/67  Pulse 61  Temp(Src) 98.5 F (36.9 C) (Oral)  Resp 18  SpO2 99% Physical Exam  Nursing note and vitals reviewed. Constitutional: He appears well-developed and well-nourished. No distress.  HENT:  Head: Normocephalic and atraumatic.  Eyes: Pupils are equal, round, and reactive to light.  Neck: Normal range of motion. Neck supple.  Cardiovascular: Normal rate and regular rhythm.   Pulmonary/Chest: Effort normal.  Abdominal: Soft.  Musculoskeletal:       Left forearm: He exhibits tenderness and bony tenderness. He exhibits no swelling, no edema, no deformity and no laceration.  Neurological: He is alert.  Skin: Skin is warm and dry.    ED Course  Procedures (including critical care time) Labs Review Labs Reviewed -  No data to display  Imaging Review Dg Forearm Left  08/21/2013   CLINICAL DATA:  Left forearm pain.  Injury last year.  Numbness.  EXAM: LEFT FOREARM - 2 VIEW  COMPARISON:  04/09/2013  FINDINGS: Persistent fracture plantae along the proximal to mid ulnar shaft with sclerotic margins favoring nonunion. This is traversed by plate and screw fixator. Adjacent bony sclerosis noted along the fracture plane. Screw tracks from prior fixation noted in the ulna.  Plate and screw fixator of the radius also noted. No lucency along the screws or backing out of the screws noted.  The ulna appears slightly shortened compared to the radius based on negative ulnar variance. No elbow joint effusion.  IMPRESSION: 1. Similar appearance of fracture nonunion in the ulnar shaft, bridged by plate and screw fixator.   Electronically Signed   By: Herbie Baltimore M.D.   On: 08/21/2013 13:05     EKG Interpretation   Date/Time:  Wednesday August 21 2013 10:47:38 EDT Ventricular Rate:  73 PR Interval:  140 QRS Duration: 88 QT Interval:  388 QTC Calculation: 427 R Axis:   59 Text Interpretation:  Normal sinus rhythm Normal ECG No significant change  since last tracing 17 Jul 2009 Confirmed by KNAPP  MD-I, IVA ([id number]) on  08/21/2013 11:10:35 AM      MDM   Final diagnoses:  Forearm joint pain, left    Pt needs to see ortho. No acute abnormalities noted on xray. 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 I am unsure of why his leg is hurting. No physical abnormalities seen and exam is normal.   08/21/13 0000  oxyCODONE-acetaminophen (PERCOCET/ROXICET) 5-325 MG per tablet Every 6 hours PRN Discontinue Reprint 08/21/13 1315        33 y.o.Yordy Matton Deziel's evaluation in the Emergency Department is complete. It has been determined that no acute conditions requiring further emergency intervention are present at this time. The patient/guardian have been advised of the diagnosis and  plan. We have discussed signs and symptoms that warrant return to the ED, such as changes or worsening in symptoms.  Vital signs are stable at discharge. Filed Vitals:   08/21/13 1315  BP:   Pulse: 61  Temp:   Resp: 18    Patient/guardian has voiced understanding and agreed to follow-up with the PCP or specialist.      Dorthula Matas, PA-C 08/22/13 1948

## 2013-08-21 NOTE — ED Notes (Signed)
Pt in c/o chronic left arm pain from elbow to hand- had an injury in 2011, states he always has numbness to the same area and has since his surgery, states two days ago he started to notice numbness to his left leg and pain to thigh area- states last time that happened he needed the plates in his arm replaced, denies chest pain or headache, denies other symptoms

## 2013-08-21 NOTE — Discharge Instructions (Signed)

## 2013-08-27 NOTE — ED Provider Notes (Signed)
Medical screening examination/treatment/procedure(s) were performed by non-physician practitioner and as supervising physician I was immediately available for consultation/collaboration.   EKG Interpretation   Date/Time:  Wednesday August 21 2013 10:47:38 EDT Ventricular Rate:  73 PR Interval:  140 QRS Duration: 88 QT Interval:  388 QTC Calculation: 427 R Axis:   59 Text Interpretation:  Normal sinus rhythm Normal ECG No significant change  since last tracing 17 Jul 2009 Confirmed by [facility]  MD-I, Linford Quintela ([id number]) on  08/21/2013 11:10:35 AM      Devoria AlbeIva Rohin Krejci, MD, Franz DellFACEP   Aliea Bobe L Jazzlin Clements, MD 08/27/13 [phone]

## 2013-10-04 ENCOUNTER — Emergency Department (HOSPITAL_COMMUNITY): Payer: No Typology Code available for payment source

## 2013-10-04 ENCOUNTER — Encounter (HOSPITAL_COMMUNITY): Payer: Self-pay | Admitting: Emergency Medicine

## 2013-10-04 ENCOUNTER — Emergency Department (HOSPITAL_COMMUNITY)
Admission: EM | Admit: 2013-10-04 | Discharge: 2013-10-04 | Disposition: A | Discharge status: Home / Self Care | Payer: No Typology Code available for payment source | Attending: Emergency Medicine | Admitting: Emergency Medicine

## 2013-10-04 DIAGNOSIS — Z9889 Other specified postprocedural states: Secondary | ICD-10-CM | POA: Insufficient documentation

## 2013-10-04 DIAGNOSIS — Z8614 Personal history of Methicillin resistant Staphylococcus aureus infection: Secondary | ICD-10-CM | POA: Diagnosis not present

## 2013-10-04 DIAGNOSIS — R110 Nausea: Secondary | ICD-10-CM | POA: Insufficient documentation

## 2013-10-04 DIAGNOSIS — F1410 Cocaine abuse, uncomplicated: Secondary | ICD-10-CM | POA: Insufficient documentation

## 2013-10-04 DIAGNOSIS — R519 Headache, unspecified: Secondary | ICD-10-CM

## 2013-10-04 DIAGNOSIS — R51 Headache: Secondary | ICD-10-CM | POA: Diagnosis not present

## 2013-10-04 DIAGNOSIS — F17200 Nicotine dependence, unspecified, uncomplicated: Secondary | ICD-10-CM | POA: Insufficient documentation

## 2013-10-04 DIAGNOSIS — R079 Chest pain, unspecified: Secondary | ICD-10-CM | POA: Insufficient documentation

## 2013-10-04 DIAGNOSIS — Z8781 Personal history of (healed) traumatic fracture: Secondary | ICD-10-CM | POA: Insufficient documentation

## 2013-10-04 LAB — CBC
HCT: 40.9 % (ref 39.0–52.0)
Hemoglobin: 14.4 g/dL (ref 13.0–17.0)
MCH: 28.6 pg (ref 26.0–34.0)
MCHC: 35.2 g/dL (ref 30.0–36.0)
MCV: 81.3 fL (ref 78.0–100.0)
PLATELETS: 372 10*3/uL (ref 150–400)
RBC: 5.03 MIL/uL (ref 4.22–5.81)
RDW: 13.8 % (ref 11.5–15.5)
WBC: 7.6 10*3/uL (ref 4.0–10.5)

## 2013-10-04 LAB — BASIC METABOLIC PANEL
ANION GAP: 16 — AB (ref 5–15)
BUN: 12 mg/dL (ref 6–23)
CALCIUM: 9 mg/dL (ref 8.4–10.5)
CO2: 23 mEq/L (ref 19–32)
CREATININE: 1.05 mg/dL (ref 0.50–1.35)
Chloride: 100 mEq/L (ref 96–112)
GFR calc non Af Amer: >90 mL/min (ref >90)
Glucose, Bld: 89 mg/dL (ref 70–99)
Potassium: 4.1 mEq/L (ref 3.7–5.3)
Sodium: 139 mEq/L (ref 137–147)

## 2013-10-04 LAB — I-STAT TROPONIN, ED
TROPONIN I, POC: 0 ng/mL (ref 0.00–0.08)
Troponin i, poc: 0 ng/mL (ref 0.00–0.08)

## 2013-10-04 MED ORDER — METOCLOPRAMIDE HCL 5 MG/ML IJ SOLN
10.0000 mg | Freq: Once | INTRAMUSCULAR | Status: AC
  Administered 2013-10-04: 10 mg via INTRAVENOUS
  Filled 2013-10-04: qty 2

## 2013-10-04 MED ORDER — SODIUM CHLORIDE 0.9 % IV SOLN
1000.0000 mL | Freq: Once | INTRAVENOUS | Status: AC
  Administered 2013-10-04: 1000 mL via INTRAVENOUS

## 2013-10-04 MED ORDER — ASPIRIN 81 MG PO CHEW
324.0000 mg | CHEWABLE_TABLET | Freq: Once | ORAL | Status: AC
  Administered 2013-10-04: 324 mg via ORAL
  Filled 2013-10-04: qty 4

## 2013-10-04 MED ORDER — DIPHENHYDRAMINE HCL 50 MG/ML IJ SOLN
25.0000 mg | Freq: Once | INTRAMUSCULAR | Status: AC
  Administered 2013-10-04: 25 mg via INTRAVENOUS
  Filled 2013-10-04: qty 1

## 2013-10-04 MED ORDER — SODIUM CHLORIDE 0.9 % IV SOLN: 1000.0000 mL | INTRAVENOUS | Status: DC

## 2013-10-04 MED ORDER — ASPIRIN 81 MG PO CHEW
CHEWABLE_TABLET | ORAL | Status: AC
  Filled 2013-10-04: qty 4
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
## 2013-10-04 NOTE — ED Notes (Signed)
Pt reports feeling cp, a headache and numb for 2-3 days, has not attempted any otc methods of pain relief.

## 2013-10-04 NOTE — ED Notes (Signed)
Patient transported to X-ray 

## 2013-10-04 NOTE — Discharge Instructions (Signed)
DO NOT USE COCAINE!!!  General Headache Without Cause A headache is pain or discomfort felt around the head or neck area. The specific cause of a headache may not be found. There are many causes and types of headaches. A few common ones are:  Tension headaches.  Migraine headaches.  Cluster headaches.  Chronic daily headaches. HOME CARE INSTRUCTIONS   Keep all follow-up appointments with your caregiver or any specialist referral.  Only take over-the-counter or prescription medicines for pain or discomfort as directed by your caregiver.  Lie down in a dark, quiet room when you have a headache.  Keep a headache journal to find out what may trigger your migraine headaches. For example, write down:  What you eat and drink.  How much sleep you get.  Any change to your diet or medicines.  Try massage or other relaxation techniques.  Put ice packs or heat on the head and neck. Use these 3 to 4 times per day for 15 to 20 minutes each time, or as needed.  Limit stress.  Sit up straight, and do not tense your muscles.  Quit smoking if you smoke.  Limit alcohol use.  Decrease the amount of caffeine you drink, or stop drinking caffeine.  Eat and sleep on a regular schedule.  Get 7 to 9 hours of sleep, or as recommended by your caregiver.  Keep lights dim if bright lights bother you and make your headaches worse. SEEK MEDICAL CARE IF:   You have problems with the medicines you were prescribed.  Your medicines are not working.  You have a change from the usual headache.  You have nausea or vomiting. SEEK IMMEDIATE MEDICAL CARE IF:   Your headache becomes severe.  You have a fever.  You have a stiff neck.  You have loss of vision.  You have muscular weakness or loss of muscle control.  You start losing your balance or have trouble walking.  You feel faint or pass out.  You have severe symptoms that are different from your first symptoms. MAKE SURE YOU:    Understand these instructions.  Will watch your condition.  Will get help right away if you are not doing well or get worse. Document Released: 01/24/2005 Document Revised: 04/18/2011 Document Reviewed: 02/09/2011 [facility] Patient Information 2015 [place], [place]. This information is not intended to replace advice given to you by your health care provider. Make sure you discuss any questions you have with your health care provider.  Chest Pain (Nonspecific) It is often hard to give a specific diagnosis for the cause of chest pain. There is always a chance that your pain could be related to something serious, such as a heart attack or a blood clot in the lungs. You need to follow up with your health care provider for further evaluation. CAUSES   Heartburn.  Pneumonia or bronchitis.  Anxiety or stress.  Inflammation around your heart (pericarditis) or lung (pleuritis or pleurisy).  A blood clot in the lung.  A collapsed lung (pneumothorax). It can develop suddenly on its own (spontaneous pneumothorax) or from trauma to the chest.  Shingles infection (herpes zoster virus). The chest wall is composed of bones, muscles, and cartilage. Any of these can be the source of the pain.  The bones can be bruised by injury.  The muscles or cartilage can be strained by coughing or overwork.  The cartilage can be affected by inflammation and become sore (costochondritis). DIAGNOSIS  Lab tests or other studies may be needed  to find the cause of your pain. Your health care provider may have you take a test called an ambulatory electrocardiogram (ECG). An ECG records your heartbeat patterns over a 24-hour period. You may also have other tests, such as:  Transthoracic echocardiogram (TTE). During echocardiography, sound waves are used to evaluate how blood flows through your heart.  Transesophageal echocardiogram (TEE).  Cardiac monitoring. This allows your health care provider to monitor your  heart rate and rhythm in real time.  Holter monitor. This is a portable device that records your heartbeat and can help diagnose heart arrhythmias. It allows your health care provider to track your heart activity for several days, if needed.  Stress tests by exercise or by giving medicine that makes the heart beat faster. TREATMENT   Treatment depends on what may be causing your chest pain. Treatment may include:  Acid blockers for heartburn.  Anti-inflammatory medicine.  Pain medicine for inflammatory conditions.  Antibiotics if an infection is present.  You may be advised to change lifestyle habits. This includes stopping smoking and avoiding alcohol, caffeine, and chocolate.  You may be advised to keep your head raised (elevated) when sleeping. This reduces the chance of acid going backward from your stomach into your esophagus. Most of the time, nonspecific chest pain will improve within 2-3 days with rest and mild pain medicine.  HOME CARE INSTRUCTIONS   If antibiotics were prescribed, take them as directed. Finish them even if you start to feel better.  For the next few days, avoid physical activities that bring on chest pain. Continue physical activities as directed.  Do not use any tobacco products, including cigarettes, chewing tobacco, or electronic cigarettes.  Avoid drinking alcohol.  Only take medicine as directed by your health care provider.  Follow your health care provider's suggestions for further testing if your chest pain does not go away.  Keep any follow-up appointments you made. If you do not go to an appointment, you could develop lasting (chronic) problems with pain. If there is any problem keeping an appointment, call to reschedule. SEEK MEDICAL CARE IF:   Your chest pain does not go away, even after treatment.  You have a rash with blisters on your chest.  You have a fever. SEEK IMMEDIATE MEDICAL CARE IF:   You have increased chest pain or pain  that spreads to your arm, neck, jaw, back, or abdomen.  You have shortness of breath.  You have an increasing cough, or you cough up blood.  You have severe back or abdominal pain.  You feel nauseous or vomit.  You have severe weakness.  You faint.  You have chills. This is an emergency. Do not wait to see if the pain will go away. Get medical help at once. Call your local emergency services (911 in [place] Do not drive yourself to the hospital. MAKE SURE YOU:   Understand these instructions.  Will watch your condition.  Will get help right away if you are not doing well or get worse. Document Released: 11/03/2004 Document Revised: 01/29/2013 Document Reviewed: 08/30/2007 [facility] Patient Information 2015 [place], [place]. This information is not intended to replace advice given to you by your health care provider. Make sure you discuss any questions you have with your health care provider.

## 2014-11-24 IMAGING — CR DG FOREARM 2V*L*
3 series · 3 of 3 positions shown · non-contrast
Comparison: 03/28/2013

CLINICAL DATA: Left forearm pain. History of prior fracture and
repair.

EXAM:
LEFT FOREARM - 2 VIEW

[view not recorded (1 of 3)]
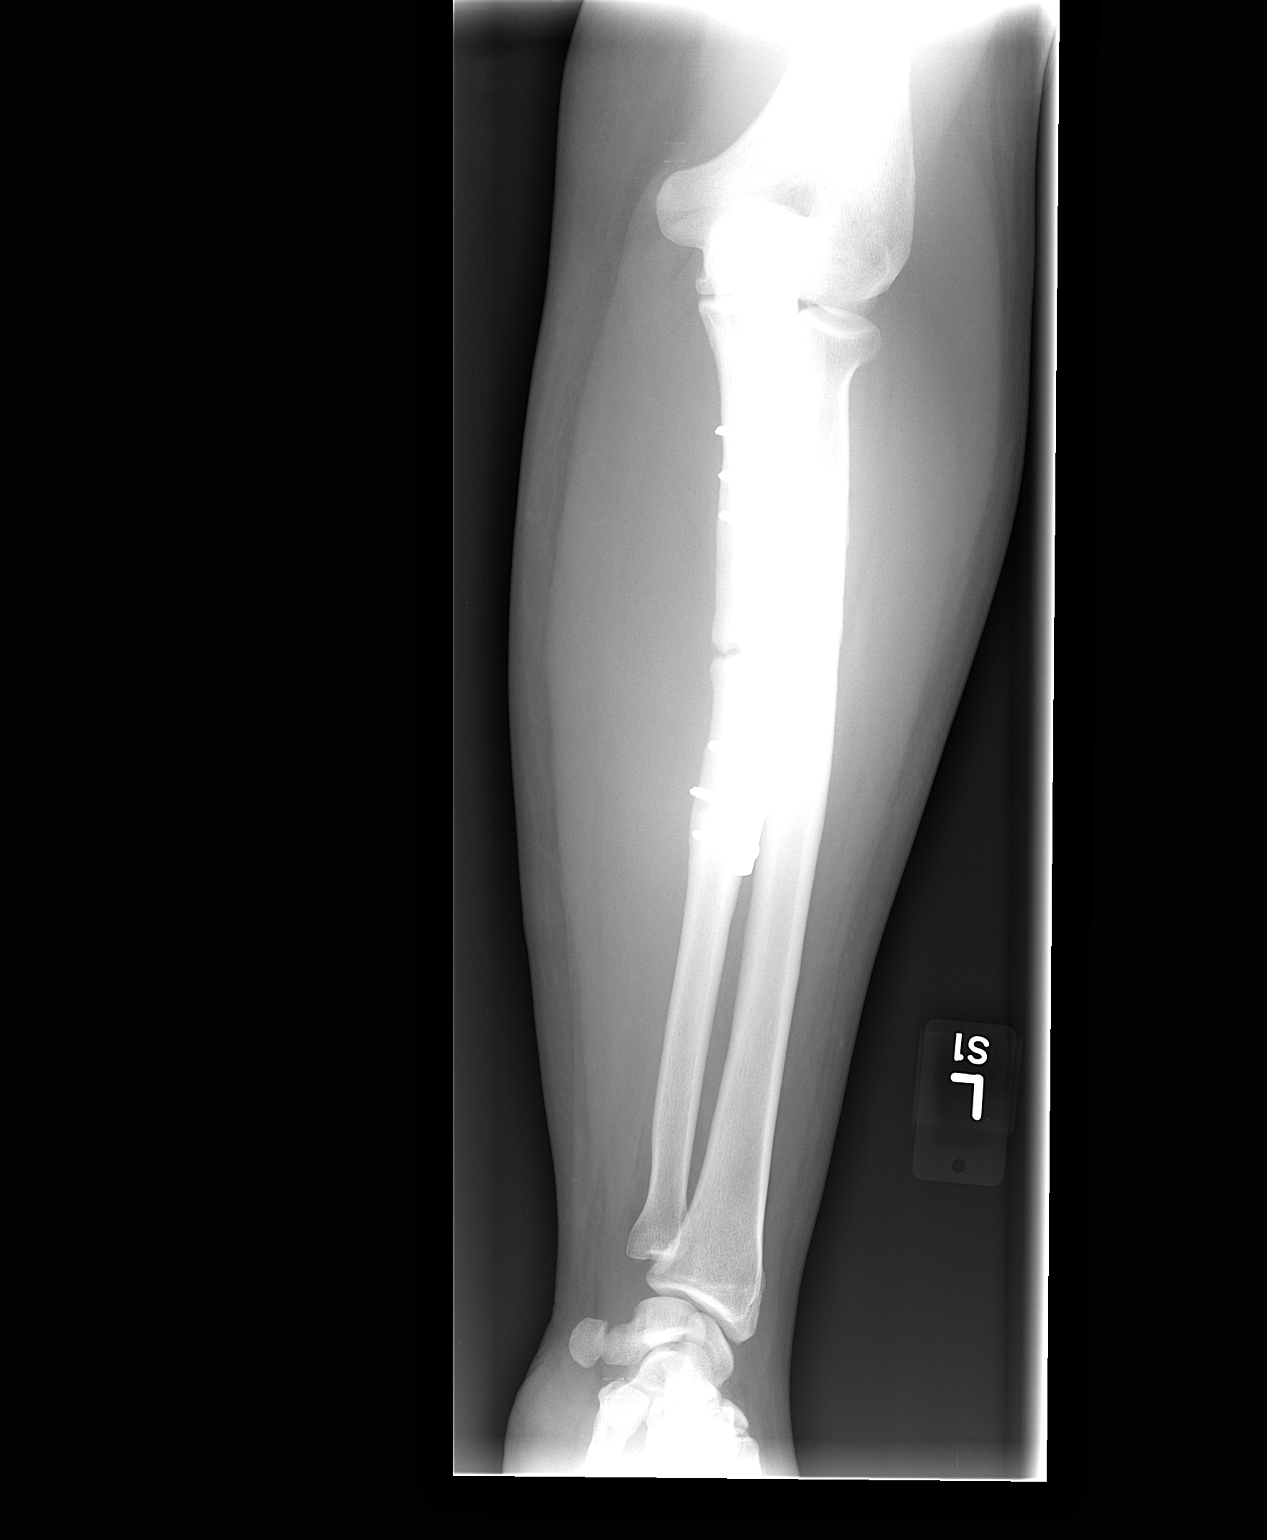

[view not recorded (2 of 3)]
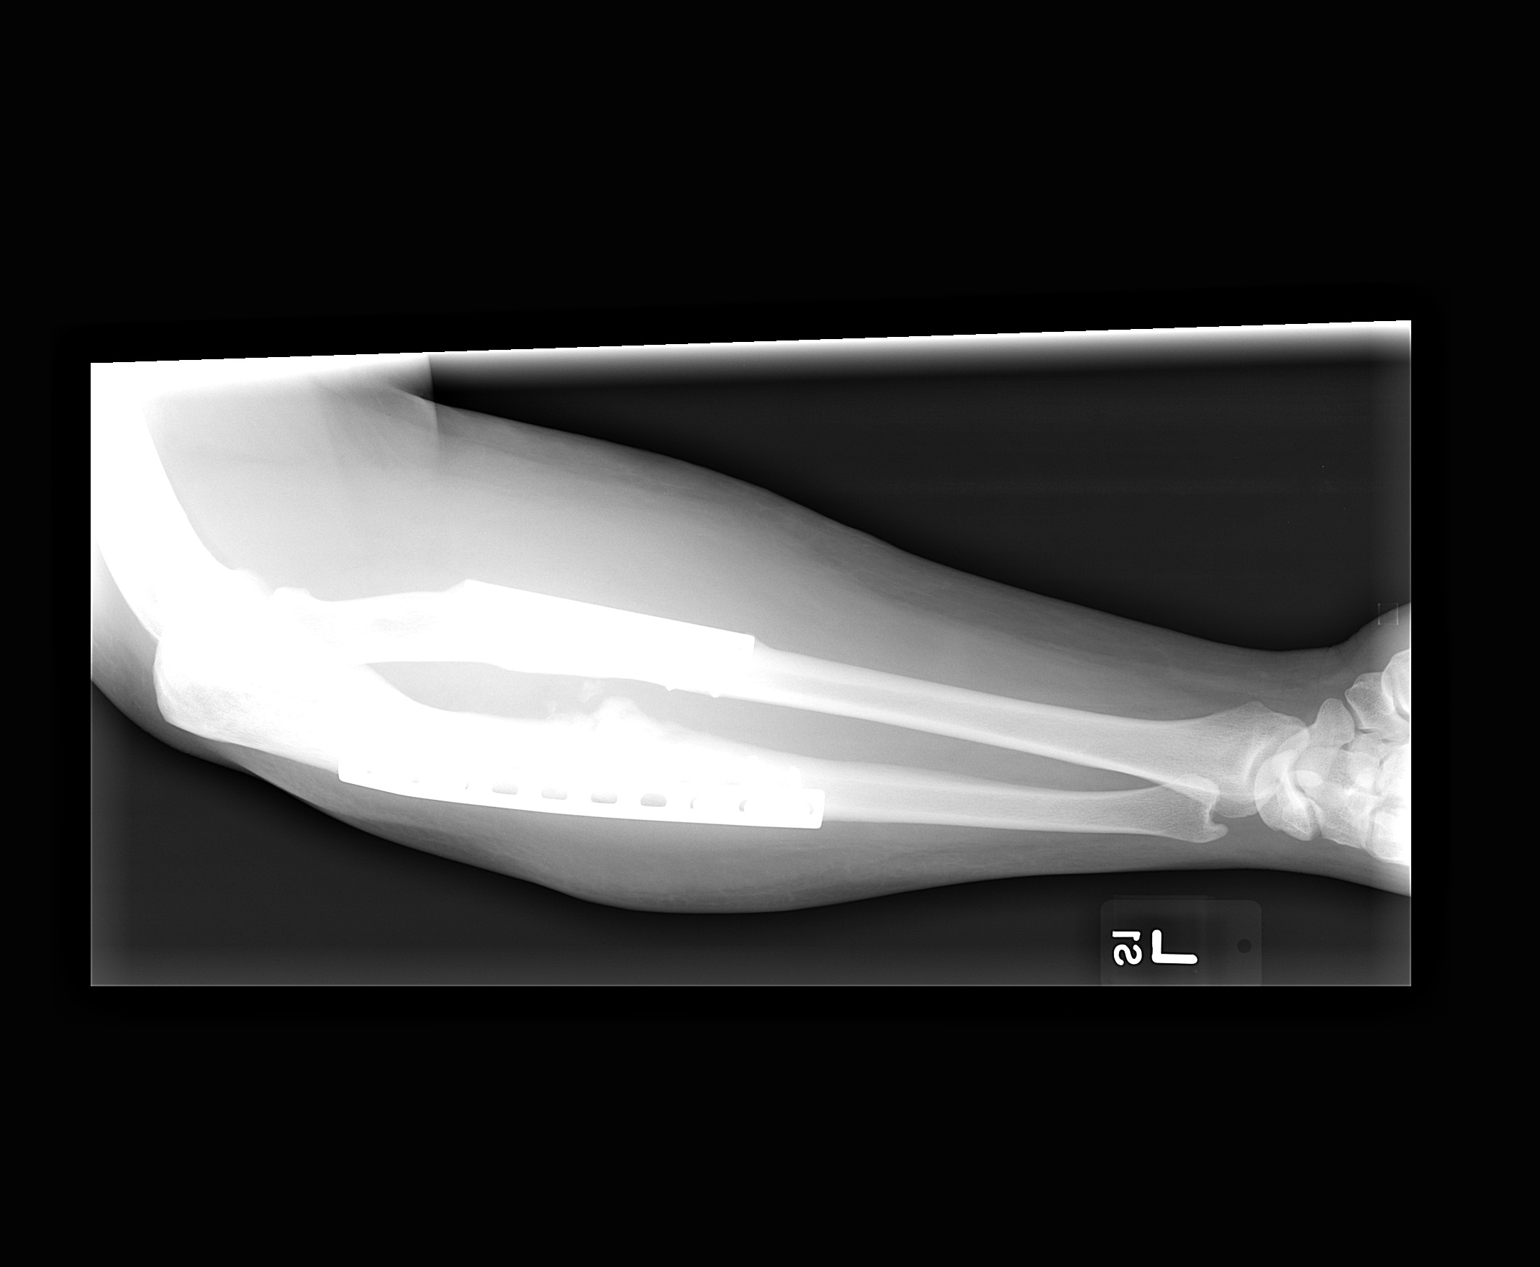

[view not recorded (3 of 3)]
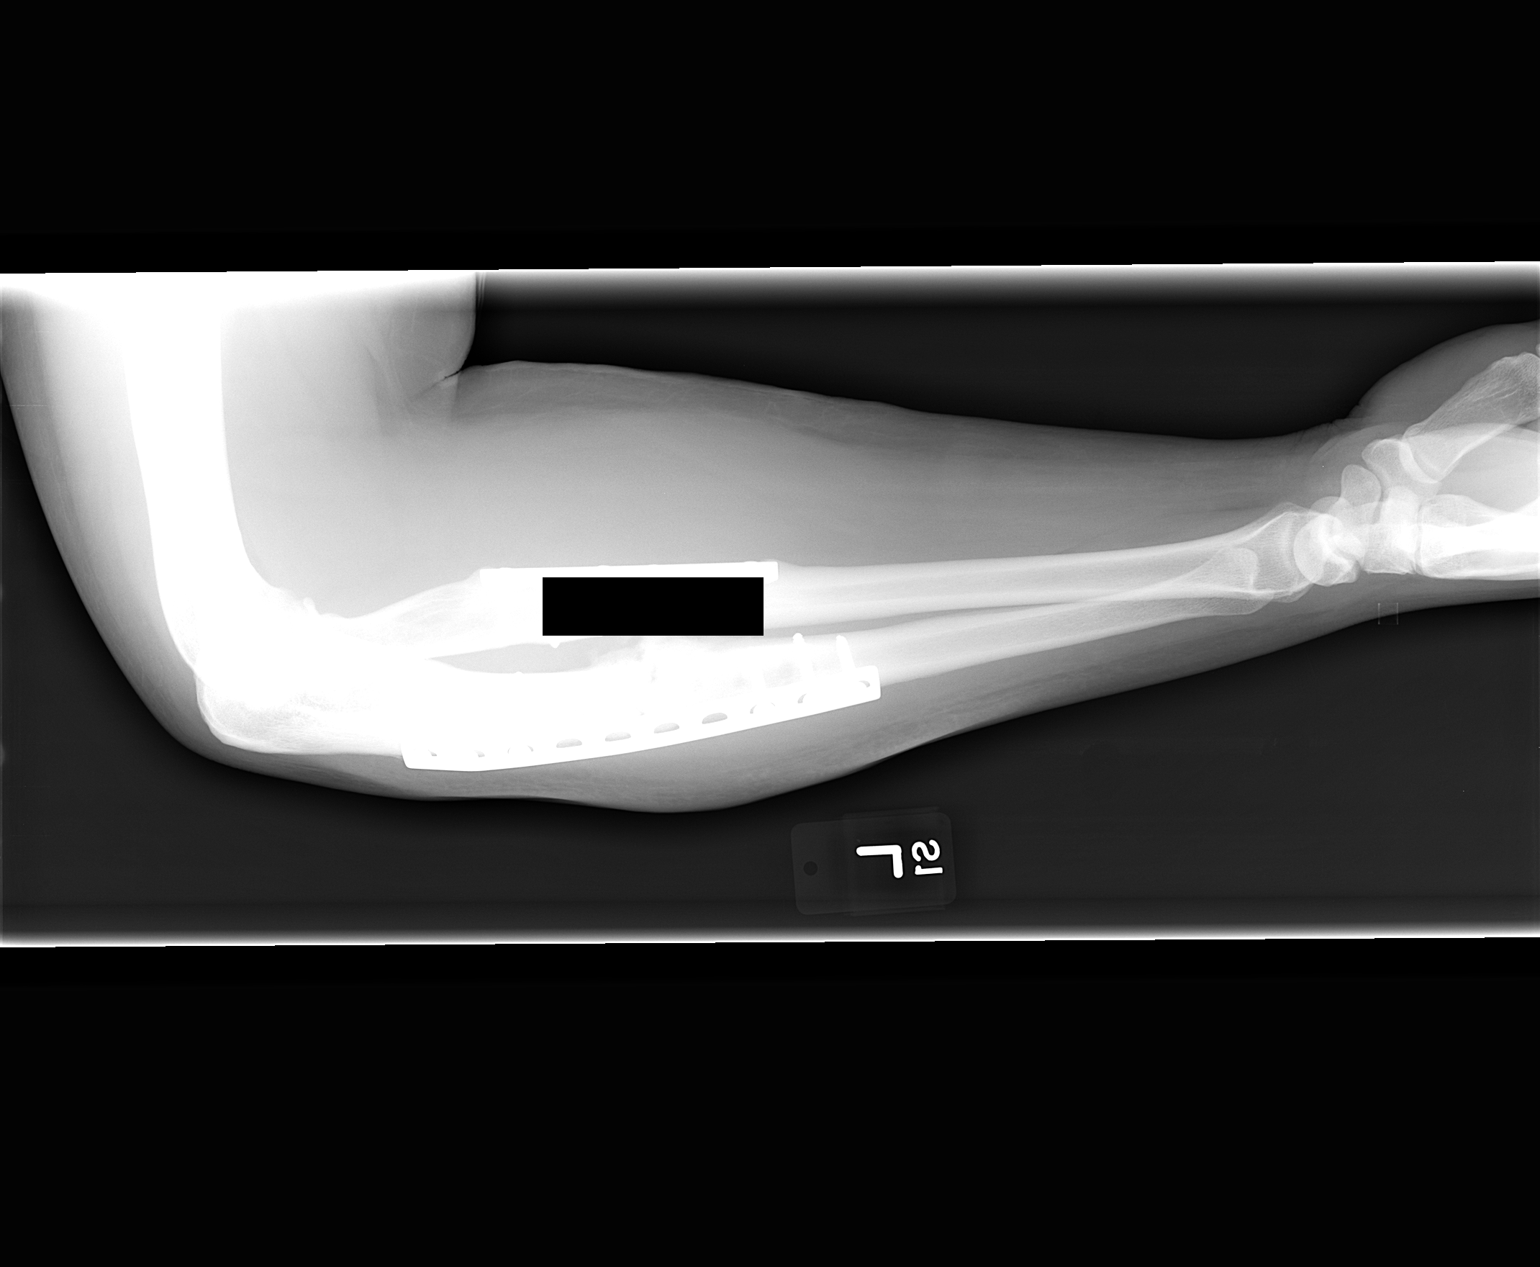

[3 of 3 positions shown; findings below may reference images not displayed]

FINDINGS: The hardware appears stable. The radius fracture is completely
healed without complicating features. There is a persistent fracture
line noted at the mid ulnar fracture.
IMPRESSION: Persistent incomplete healing of the midshaft ulnar fracture.

Completely healed midshaft radius fracture.

## 2016-05-04 ENCOUNTER — Emergency Department (HOSPITAL_COMMUNITY): Payer: No Typology Code available for payment source

## 2016-05-04 ENCOUNTER — Emergency Department (HOSPITAL_COMMUNITY)
Admission: EM | Admit: 2016-05-04 | Discharge: 2016-05-04 | Disposition: A | Discharge status: Home / Self Care | Payer: No Typology Code available for payment source | Attending: Emergency Medicine | Admitting: Emergency Medicine

## 2016-05-04 ENCOUNTER — Encounter (HOSPITAL_COMMUNITY): Payer: Self-pay | Admitting: Emergency Medicine

## 2016-05-04 DIAGNOSIS — Y999 Unspecified external cause status: Secondary | ICD-10-CM | POA: Insufficient documentation

## 2016-05-04 DIAGNOSIS — F17210 Nicotine dependence, cigarettes, uncomplicated: Secondary | ICD-10-CM | POA: Insufficient documentation

## 2016-05-04 DIAGNOSIS — W208XXA Other cause of strike by thrown, projected or falling object, initial encounter: Secondary | ICD-10-CM | POA: Insufficient documentation

## 2016-05-04 DIAGNOSIS — Y939 Activity, unspecified: Secondary | ICD-10-CM | POA: Insufficient documentation

## 2016-05-04 DIAGNOSIS — Y929 Unspecified place or not applicable: Secondary | ICD-10-CM | POA: Insufficient documentation

## 2016-05-04 DIAGNOSIS — S6991XA Unspecified injury of right wrist, hand and finger(s), initial encounter: Secondary | ICD-10-CM | POA: Insufficient documentation

## 2016-05-04 NOTE — ED Provider Notes (Signed)
[facility]-EMERGENCY DEPT Provider Note   CSN: [id number] Arrival date & time: 05/04/16  4098     History   Chief Complaint Chief Complaint  Patient presents with  . Finger Injury    HPI Jermaine Hunt is a 36 y.o. male presenting after injury to the right middle finger from dropping a dumbbell. His finger has been swollen for a couple days and he is unable to bend at the PIP and DIP due to pain. He has not tried anything at home for pain or swelling. Denies erythema, warmth, fever, chills or any other symptoms.  HPI  Past Medical History:  Diagnosis Date  . 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 History of MRSA infection 2013  . Retained orthopedic hardware 07/2012   left ulna    There are no active problems to display for this patient.   Past Surgical History:  Procedure Laterality Date  . HARDWARE REMOVAL Left 07/17/2012   Procedure: HARDWARE REMOVAL LEFT ULNAR NON UNION ;  Surgeon: Mable Paris, MD;  Location: [facility];  Service: Orthopedics;  Laterality: Left;  . ORIF RADIUS & ULNA FRACTURES Left 06/15/2009  . ORIF ULNAR FRACTURE Left 07/17/2012   Procedure: OPEN REDUCTION INTERNAL FIXATION (ORIF) LEFT ULNAR NON UNION ;  Surgeon: Mable Paris, MD;  Location: [facility];  Service: Orthopedics;  Laterality: Left;       Home Medications    Prior to Admission medications   Not on File    Family History No family history on file.  Social History Social History  Substance Use Topics  . Smoking status: Current Some Day Smoker    Years: 8.00    Types: Cigarettes  . Smokeless tobacco: Never Used     Comment: 4 cig./day  . Alcohol use Yes     Comment: socially     Allergies   Nsaids and Sulfa antibiotics   Review of Systems Review of Systems  Constitutional: Negative for chills and fever.  Respiratory: Negative for cough, shortness of breath, wheezing and stridor.   Cardiovascular: Negative for chest  pain and palpitations.  Musculoskeletal: Positive for arthralgias and joint swelling. Negative for back pain.  Skin: Negative for color change, pallor, rash and wound.  Neurological: Negative for weakness and numbness.  All other systems reviewed and are negative.    Physical Exam Updated Vital Signs BP 130/90 (BP Location: Right Arm)   Pulse 80   Temp 97.6 F (36.4 C) (Oral)   Resp 16   Ht 5\' 11"  (1.803 m)   Wt 122.5 kg   SpO2 97%   BMI 37.66 kg/m   Physical Exam  Constitutional: He appears well-developed and well-nourished. No distress.  Afebrile, nontoxic-appearing, sitting comfortably in bed in no acute distress.  HENT:  Head: Normocephalic and atraumatic.  Eyes: Conjunctivae and EOM are normal.  Cardiovascular: Normal rate, regular rhythm, normal heart sounds and intact distal pulses.   No murmur heard. Pulmonary/Chest: Effort normal and breath sounds normal. No respiratory distress. He has no wheezes. He has no rales. He exhibits no tenderness.  Musculoskeletal: He exhibits edema and tenderness. He exhibits no deformity.  Patient has difficulty bending at the DIP and PIP due to pain.  Has passive range of motion, neurovascularly intact. Decreased strength with resisted bending at the PIP and DIP  Neurological: He is alert. No sensory deficit.  Skin: Skin is warm and dry. No rash noted. He is not diaphoretic. No erythema. No  pallor.  Psychiatric: He has a normal mood and affect.  Nursing note and vitals reviewed.    ED Treatments / Results  Labs (all labs ordered are listed, but only abnormal results are displayed) Labs Reviewed - No data to display  EKG  EKG Interpretation None       Radiology Dg Finger Middle Right  Result Date: 05/04/2016 CLINICAL DATA:  Dropped heavy weight on right middle finger 2-3 days ago. Pain and swelling. EXAM: RIGHT MIDDLE FINGER 2+V COMPARISON:  None. FINDINGS: Soft tissue swelling within the right middle finger. No acute bony  abnormality. Specifically, no fracture, subluxation, or dislocation. Soft tissues are intact. IMPRESSION: No bony abnormality. Electronically Signed   By: Charlett NoseKevin  Dover M.D.   On: 05/04/2016 09:06    Procedures Procedures (including critical care time)  Medications Ordered in ED Medications - No data to display   Initial Impression / Assessment and Plan / ED Course  I have reviewed the triage vital signs and the nursing notes.  Pertinent labs & imaging results that were available during my care of the patient were reviewed by me and considered in my medical decision making (see chart for details).     Otherwise healthy 36 year old male presenting with finger injury to the right middle finger. X-ray negative for any fractures dislocations or soft tissue injury. On exam his finger is swollen and has a decreased range of motion. No erythema, warmth or evidence of systemic symptoms. Exam otherwise unremarkable patient is well-appearing, afebrile, nontoxic.  Ice and splint applied to patient's hand while in the ED. Patient refused pain medications as he has to return to work. Discharge home with follow-up with hand surgery and rice protocol.  Discussed strict return precautions and advised to return to the emergency department if experiencing any new or worsening symptoms. Instructions were understood and patient agreed with discharge plan.  Final Clinical Impressions(s) / ED Diagnoses   Final diagnoses:  Injury of finger of right hand, initial encounter    New Prescriptions New Prescriptions   No medications on file     Gregary CromerJessica B Taiga Lupinacci, PA-C 05/04/16 2049    Alvira MondayErin Schlossman, MD 05/07/16 [phone]

## 2016-05-04 NOTE — Discharge Instructions (Signed)
Apply ice and follow RICE protocol as above. Follow up with hand specialist listed if swelling doesn't improve or you are unable to bend your finger. Return to the emergency department if the swelling increases, pain increases, it becomes red or warm to the touch, he developed a fever/chills or any other new concerning symptoms.

## 2016-05-04 NOTE — ED Notes (Signed)
Bed: [facility] Expected date:  Expected time:  Means of arrival:  Comments: 

## 2016-05-04 NOTE — ED Triage Notes (Signed)
Patient reports dropping a weight on his right middle finger 2-3 days ago. Swelling noted with decreased movement.

## 2016-08-28 ENCOUNTER — Emergency Department (HOSPITAL_COMMUNITY)
Admission: EM | Admit: 2016-08-28 | Discharge: 2016-08-28 | Disposition: A | Discharge status: Home / Self Care | Payer: No Typology Code available for payment source | Attending: Emergency Medicine | Admitting: Emergency Medicine

## 2016-08-28 ENCOUNTER — Emergency Department (HOSPITAL_COMMUNITY): Payer: No Typology Code available for payment source

## 2016-08-28 ENCOUNTER — Encounter (HOSPITAL_COMMUNITY): Payer: Self-pay | Admitting: Emergency Medicine

## 2016-08-28 DIAGNOSIS — M79632 Pain in left forearm: Secondary | ICD-10-CM | POA: Insufficient documentation

## 2016-08-28 DIAGNOSIS — F17210 Nicotine dependence, cigarettes, uncomplicated: Secondary | ICD-10-CM | POA: Insufficient documentation

## 2016-08-28 MED ORDER — ACETAMINOPHEN 325 MG PO TABS
650.0000 mg | ORAL_TABLET | Freq: Once | ORAL | Status: AC
  Administered 2016-08-28: 650 mg via ORAL
  Filled 2016-08-28: qty 2
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
## 2016-08-28 NOTE — Discharge Instructions (Signed)
Your x-ray today is reassuring. Your hardware appears appropriately positioned. We recommend icing and Tylenol for management of your symptoms. Follow-up with Dr. Ave Filterhandler if symptoms persist.

## 2016-08-28 NOTE — ED Notes (Signed)
Bed: WA06 Expected date:  Expected time:  Means of arrival:  Comments: 
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
